# Patient Record
Sex: Male | Born: 1955 | Race: Black or African American | Hispanic: No | Smoking: Never smoker
Health system: Southern US, Community
[De-identification: ages and names within clinical notes are randomized; demographics above are authoritative.]

## PROBLEM LIST (undated history)

## (undated) DIAGNOSIS — I10 Essential (primary) hypertension: Secondary | ICD-10-CM

## (undated) DIAGNOSIS — N200 Calculus of kidney: Secondary | ICD-10-CM

## (undated) DIAGNOSIS — Z87442 Personal history of urinary calculi: Secondary | ICD-10-CM

## (undated) HISTORY — PX: OTHER SURGICAL HISTORY: SHX169

## (undated) HISTORY — PX: LITHOTRIPSY: SUR834

---

## 2006-09-04 ENCOUNTER — Emergency Department: Payer: Self-pay | Admitting: Emergency Medicine

## 2006-09-19 ENCOUNTER — Ambulatory Visit: Payer: Self-pay | Admitting: Specialist

## 2006-09-29 ENCOUNTER — Ambulatory Visit: Payer: Self-pay | Admitting: Specialist

## 2006-10-12 ENCOUNTER — Ambulatory Visit: Payer: Self-pay | Admitting: Specialist

## 2006-11-08 ENCOUNTER — Ambulatory Visit: Payer: Self-pay | Admitting: Specialist

## 2006-12-15 ENCOUNTER — Ambulatory Visit: Payer: Self-pay | Admitting: Specialist

## 2006-12-23 ENCOUNTER — Ambulatory Visit: Payer: Self-pay | Admitting: Specialist

## 2008-03-05 IMAGING — CR DG ABDOMEN 1V
1 series · 2 of 2 positions shown · non-contrast
Comparison: none

REASON FOR EXAM: renal calculi-lithotripsy
COMMENTS:

[Series 1: view not recorded · 0.17mm/px · 2 of 2 slices shown]
[im 1/2]
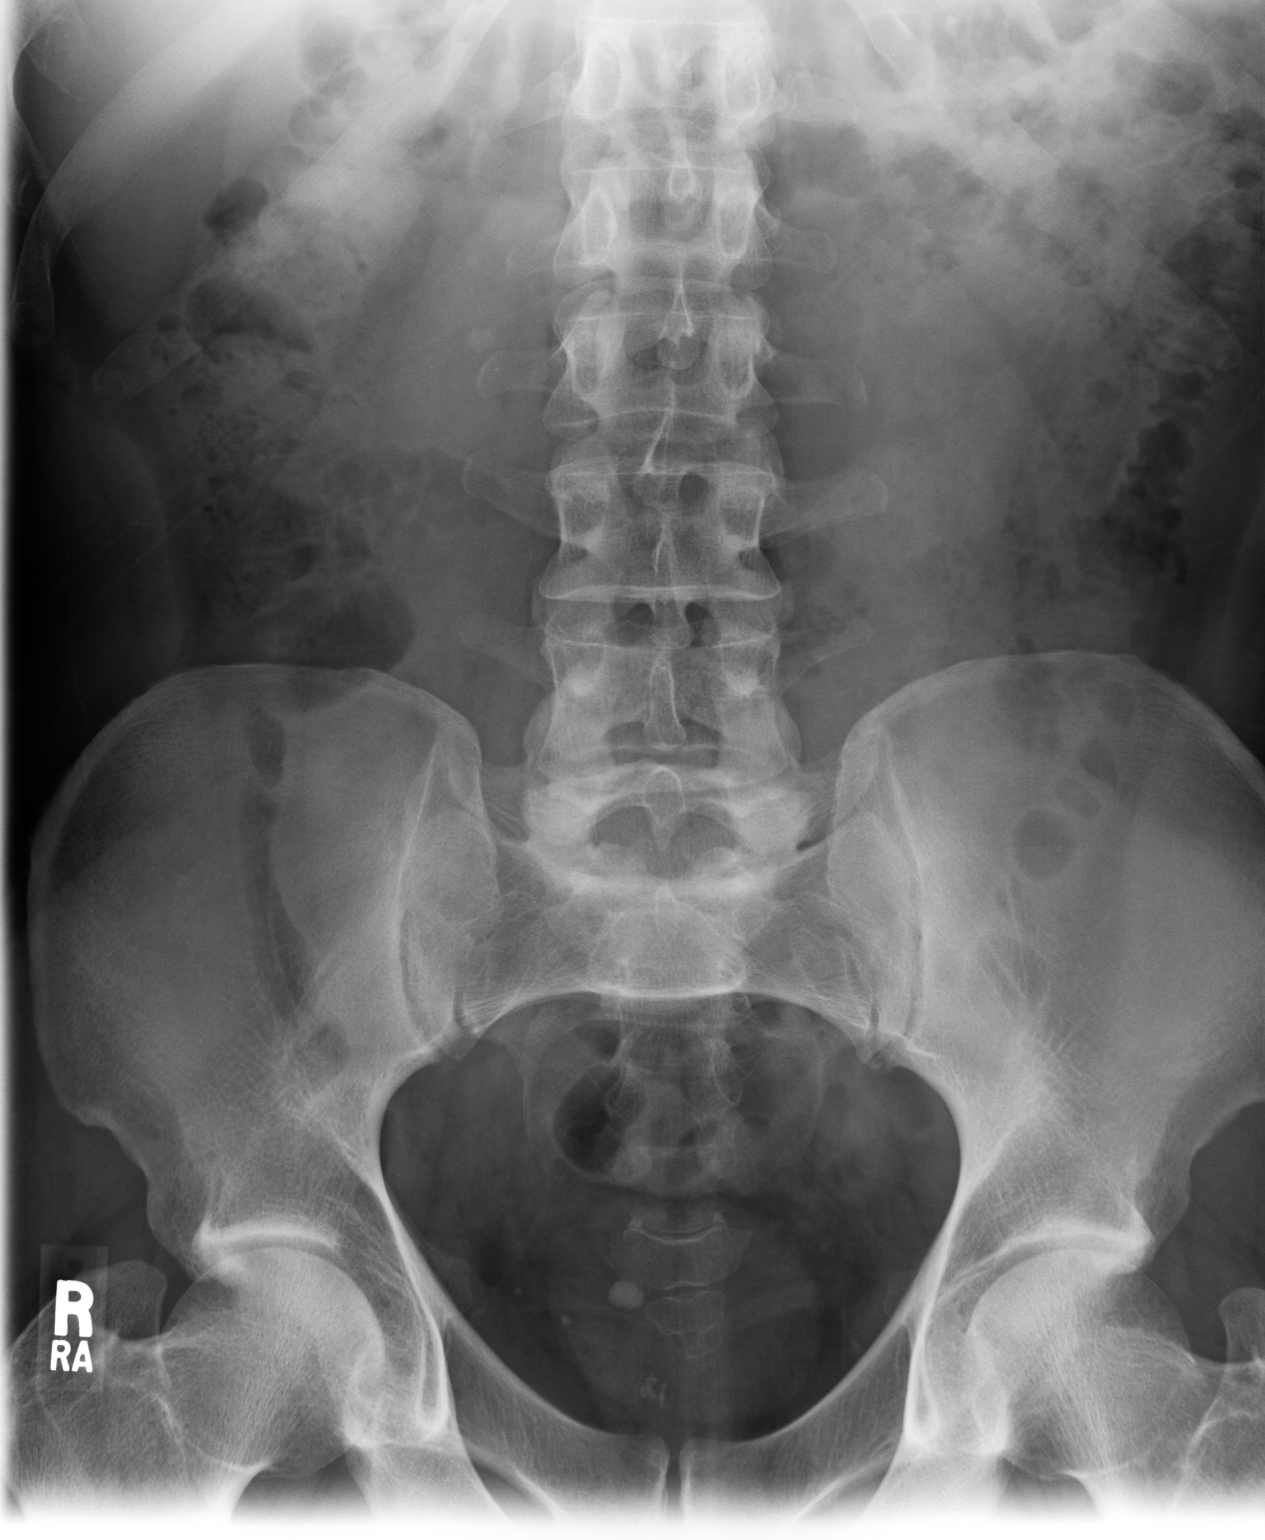
[im 2/2]
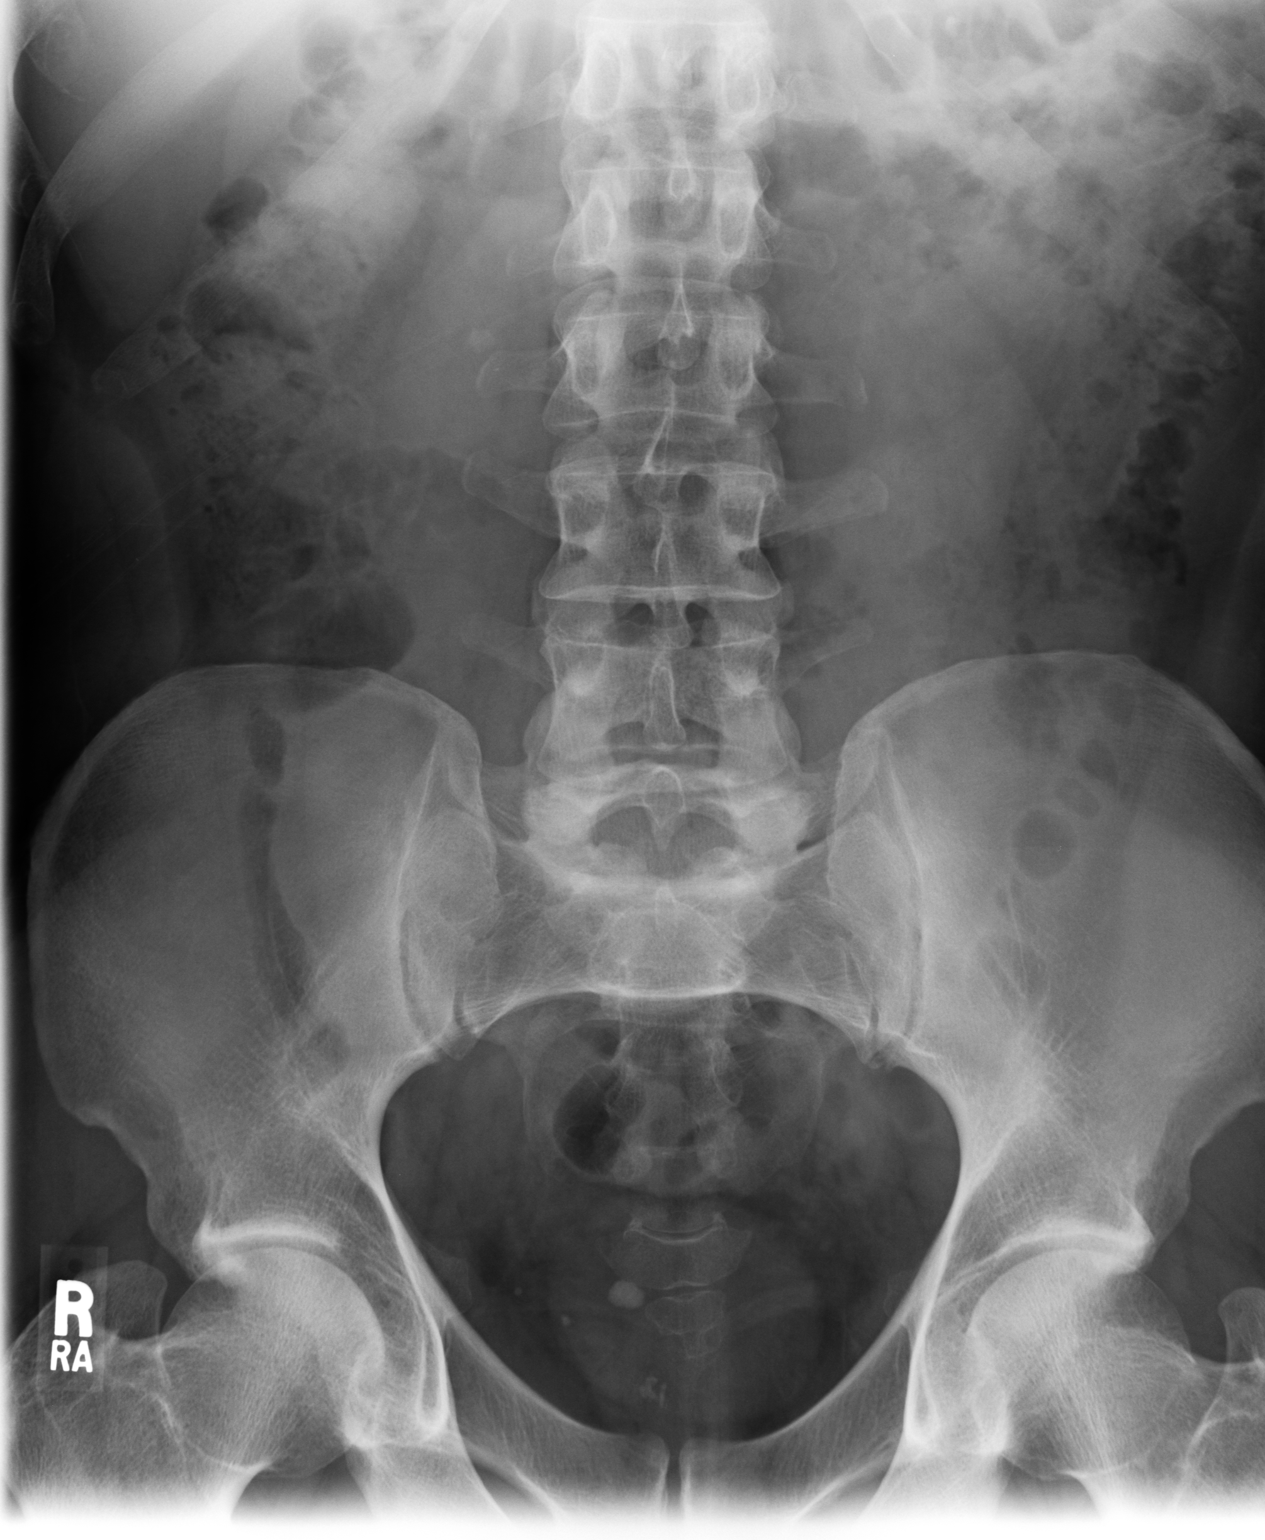

[2 of 2 positions shown; findings below may reference images not displayed]

PROCEDURE:     DXR - DXR KIDNEY URETER BLADDER  - September 29, 2006  [DATE]

RESULT:     AP view of the abdomen shows a 1 cm calcification projected at
the level of the RIGHT ureterovesical junction and consistent with a distal
RIGHT ureteral stone. Also noted is an 8.3 mm calcification projected just
above the transverse process of L3 on the RIGHT.  The finding is compatible
with either a proximal RIGHT ureteral stone or possibly a gallstone. There
also appear to be 1 or 2 renal calcifications bilaterally, but additional
stones may be present as both kidneys are partially obscured by overlying
bowel and bowel content.
IMPRESSION: 1)There are observed two calcifications on the RIGHT consistent with a
proximal and a distal RIGHT ureteral stone.

2)Bilateral renal calcifications are noted.

## 2008-10-06 ENCOUNTER — Emergency Department: Payer: Self-pay | Admitting: Emergency Medicine

## 2008-10-07 ENCOUNTER — Emergency Department: Payer: Self-pay | Admitting: Emergency Medicine

## 2011-01-12 ENCOUNTER — Emergency Department: Payer: Self-pay | Admitting: Emergency Medicine

## 2011-04-22 ENCOUNTER — Ambulatory Visit: Payer: Self-pay | Admitting: Urology

## 2011-06-08 ENCOUNTER — Ambulatory Visit: Payer: Self-pay | Admitting: Urology

## 2011-06-17 ENCOUNTER — Ambulatory Visit: Payer: Self-pay | Admitting: Urology

## 2011-06-23 ENCOUNTER — Ambulatory Visit: Payer: Self-pay | Admitting: Urology

## 2011-07-29 ENCOUNTER — Ambulatory Visit: Payer: Self-pay | Admitting: Urology

## 2011-08-11 ENCOUNTER — Ambulatory Visit: Payer: Self-pay | Admitting: Urology

## 2011-09-09 ENCOUNTER — Ambulatory Visit: Payer: Self-pay | Admitting: Urology

## 2011-10-08 ENCOUNTER — Ambulatory Visit: Payer: Self-pay | Admitting: Urology

## 2011-11-04 ENCOUNTER — Ambulatory Visit: Payer: Self-pay | Admitting: Urology

## 2014-02-08 ENCOUNTER — Ambulatory Visit: Payer: Self-pay | Admitting: Gastroenterology

## 2014-02-11 LAB — PATHOLOGY REPORT

## 2016-02-25 ENCOUNTER — Encounter: Payer: Self-pay | Admitting: Emergency Medicine

## 2016-02-25 ENCOUNTER — Emergency Department
Admission: EM | Admit: 2016-02-25 | Discharge: 2016-02-25 | Disposition: A | Discharge status: Home / Self Care | Payer: BLUE CROSS/BLUE SHIELD | Attending: Emergency Medicine | Admitting: Emergency Medicine

## 2016-02-25 ENCOUNTER — Emergency Department: Payer: BLUE CROSS/BLUE SHIELD

## 2016-02-25 DIAGNOSIS — N200 Calculus of kidney: Secondary | ICD-10-CM

## 2016-02-25 DIAGNOSIS — R109 Unspecified abdominal pain: Secondary | ICD-10-CM | POA: Diagnosis present

## 2016-02-25 HISTORY — DX: Calculus of kidney: N20.0

## 2016-02-25 LAB — LIPASE, BLOOD: LIPASE: 16 U/L (ref 11–51)

## 2016-02-25 LAB — BASIC METABOLIC PANEL
Anion gap: 6 (ref 5–15)
BUN: 17 mg/dL (ref 6–20)
CHLORIDE: 106 mmol/L (ref 101–111)
CO2: 24 mmol/L (ref 22–32)
CREATININE: 0.95 mg/dL (ref 0.61–1.24)
Calcium: 8.8 mg/dL — ABNORMAL LOW (ref 8.9–10.3)
GFR calc Af Amer: >60 mL/min (ref >60)
GFR calc non Af Amer: >60 mL/min (ref >60)
Glucose, Bld: 127 mg/dL — ABNORMAL HIGH (ref 65–99)
Potassium: 4 mmol/L (ref 3.5–5.1)
SODIUM: 136 mmol/L (ref 135–145)

## 2016-02-25 LAB — URINALYSIS COMPLETE WITH MICROSCOPIC (ARMC ONLY)
BILIRUBIN URINE: NEGATIVE
Bacteria, UA: NONE SEEN
GLUCOSE, UA: NEGATIVE mg/dL
Ketones, ur: NEGATIVE mg/dL
Nitrite: NEGATIVE
Protein, ur: 30 mg/dL — AB
SQUAMOUS EPITHELIAL / LPF: NONE SEEN
Specific Gravity, Urine: 1.028 (ref 1.005–1.030)
pH: 5 (ref 5.0–8.0)

## 2016-02-25 LAB — CBC
HCT: 46.7 % (ref 40.0–52.0)
Hemoglobin: 15.8 g/dL (ref 13.0–18.0)
MCH: 29.6 pg (ref 26.0–34.0)
MCHC: 33.9 g/dL (ref 32.0–36.0)
MCV: 87.2 fL (ref 80.0–100.0)
PLATELETS: 221 10*3/uL (ref 150–440)
RBC: 5.35 MIL/uL (ref 4.40–5.90)
RDW: 13.8 % (ref 11.5–14.5)
WBC: 7.1 10*3/uL (ref 3.8–10.6)

## 2016-02-25 MED ORDER — ONDANSETRON HCL 4 MG/2ML IJ SOLN
INTRAMUSCULAR | Status: AC
  Administered 2016-02-25: 4 mg via INTRAVENOUS
  Filled 2016-02-25: qty 2

## 2016-02-25 MED ORDER — ONDANSETRON HCL 4 MG/2ML IJ SOLN
4.0000 mg | Freq: Once | INTRAMUSCULAR | Status: AC
  Administered 2016-02-25: 4 mg via INTRAVENOUS

## 2016-02-25 MED ORDER — ONDANSETRON HCL 4 MG/2ML IJ SOLN
4.0000 mg | Freq: Once | INTRAMUSCULAR | Status: AC
  Administered 2016-02-25: 4 mg via INTRAVENOUS
  Filled 2016-02-25: qty 2

## 2016-02-25 MED ORDER — SODIUM CHLORIDE 0.9 % IV SOLN
Freq: Once | INTRAVENOUS | Status: AC
  Administered 2016-02-25: 08:00:00 via INTRAVENOUS

## 2016-02-25 MED ORDER — KETOROLAC TROMETHAMINE 30 MG/ML IJ SOLN
INTRAMUSCULAR | Status: AC
  Administered 2016-02-25: 30 mg via INTRAVENOUS
  Filled 2016-02-25: qty 1

## 2016-02-25 MED ORDER — HYDROMORPHONE HCL 1 MG/ML IJ SOLN
1.0000 mg | Freq: Once | INTRAMUSCULAR | Status: AC
  Administered 2016-02-25: 1 mg via INTRAVENOUS
  Filled 2016-02-25: qty 1

## 2016-02-25 MED ORDER — ONDANSETRON HCL 4 MG PO TABS: 4.0000 mg | ORAL_TABLET | Freq: Every day | ORAL | Status: DC | PRN

## 2016-02-25 MED ORDER — KETOROLAC TROMETHAMINE 30 MG/ML IJ SOLN
30.0000 mg | Freq: Once | INTRAMUSCULAR | Status: AC
  Administered 2016-02-25: 30 mg via INTRAVENOUS

## 2016-02-25 MED ORDER — OXYCODONE-ACETAMINOPHEN 5-325 MG PO TABS: 2.0000 | ORAL_TABLET | Freq: Four times a day (QID) | ORAL | Status: DC | PRN

## 2016-02-25 NOTE — ED Provider Notes (Signed)
[facility] Emergency Department Provider Note     Time seen: ----------------------------------------- 8:44 AM on 02/25/2016 -----------------------------------------    I have reviewed the triage vital signs and the nursing notes.   HISTORY  Chief Complaint Flank Pain    HPI Martin Burkley [place] Sr. is a 60 y.o. male who presents the ER with right flank pain. Patient reports his been dealing with kidney stones for the past several weeks but the pain increased today, he said stabbing pain, no nausea. Patient reports similar history of previous kidney stones, said for kidney stone surgeries in the past. Pain is severe, nothing is helped it.   Past Medical History  Diagnosis Date  . Kidney stones     There are no active problems to display for this patient.   History reviewed. No pertinent past surgical history.  Allergies Review of patient's allergies indicates not on file.  Social History Social History  Substance Use Topics  . Smoking status: Never Smoker   . Smokeless tobacco: None  . Alcohol Use: No    Review of Systems Constitutional: Negative for fever. Eyes: Negative for visual changes. ENT: Negative for sore throat. Cardiovascular: Negative for chest pain. Respiratory: Negative for shortness of breath. Gastrointestinal: Positive for abdominal pain Genitourinary: Negative for dysuria. Musculoskeletal: Negative for back pain. Skin: Negative for rash. Neurological: Negative for headaches, focal weakness or numbness.  10-point ROS otherwise negative.  ____________________________________________   PHYSICAL EXAM:  VITAL SIGNS: ED Triage Vitals  Enc Vitals Group     BP 02/25/16 0603 150/81 mmHg     Pulse Rate 02/25/16 0603 86     Resp 02/25/16 0603 20     Temp 02/25/16 0603 98.2 F (36.8 C)     Temp Source 02/25/16 0603 Oral     SpO2 02/25/16 0603 95 %     Weight 02/25/16 0603 195 lb (88.451 kg)     Height 02/25/16  0603 6\' 2"  (1.88 m)     Head Cir --      Peak Flow --      Pain Score 02/25/16 0604 7     Pain Loc --      Pain Edu? --      Excl. in [place]? --     Constitutional: Alert and oriented. Mild distress Eyes: Conjunctivae are normal. PERRL. Normal extraocular movements. ENT   Head: Normocephalic and atraumatic.   Nose: No congestion/rhinnorhea.   Mouth/Throat: Mucous membranes are moist.   Neck: No stridor. Cardiovascular: Normal rate, regular rhythm. Normal and symmetric distal pulses are present in all extremities. No murmurs, rubs, or gallops. Respiratory: Normal respiratory effort without tachypnea nor retractions. Breath sounds are clear and equal bilaterally. No wheezes/rales/rhonchi. Gastrointestinal: Right flank tenderness, no rebound or guarding. Normal bowel sounds.  Musculoskeletal: Nontender with normal range of motion in all extremities. No joint effusions.  No lower extremity tenderness nor edema. Neurologic:  Normal speech and language. No gross focal neurologic deficits are appreciated.  Skin:  Skin is warm, dry and intact. No rash noted. Psychiatric: Mood and affect are normal. Speech and behavior are normal. Patient exhibits appropriate insight and judgment. ____________________________________________  ED COURSE:  Pertinent labs & imaging results that were available during my care of the patient were reviewed by me and considered in my medical decision making (see chart for details). Patient is in mild to moderate distress from apparent kidney stone and right flank pain. He'll receive IV Dilaudid, saline and we will likely need CT imaging.  ____________________________________________    LABS (pertinent positives/negatives)  Labs Reviewed  BASIC METABOLIC PANEL - Abnormal; Notable for the following:    Glucose, Bld 127 (*)    Calcium 8.8 (*)    All other components within normal limits  URINALYSIS COMPLETEWITH MICROSCOPIC ([facility] ONLY) - Abnormal; Notable  for the following:    Color, Urine YELLOW (*)    APPearance CLEAR (*)    Hgb urine dipstick 3+ (*)    Protein, ur 30 (*)    Leukocytes, UA TRACE (*)    All other components within normal limits  CBC  LIPASE, BLOOD    RADIOLOGY Images were viewed by me  CT renal protocol IMPRESSION: Intrarenal calculi bilaterally. No hydronephrosis. No ureteral calculi on either side.  Most small bowel loops are fluid-filled. This finding may be seen normally but also may be indicative of a degree of early ileus or enteritis. No bowel obstruction.  Appendix appears normal. No abscess.  There are scattered prostatic calculi.  There is a small ventral hernia containing only fat. There is fat in each inguinal ring.  ____________________________________________  FINAL ASSESSMENT AND PLAN  Kidney stones  Plan: Patient with labs and imaging as dictated above. Patient with a possibly recently passed kidney stone. CT scan is unremarkable. He'll be discharged with pain medication and close follow-up with his doctor for reevaluation.   Earleen Newport, MD   Earleen Newport, MD 02/25/16 [phone]

## 2016-02-25 NOTE — ED Notes (Signed)
Pt presents to triage room ambulatory with steady gait, pt reports he has been dealing with kidney stones for the past 2 weeks but pain increased today. Pt reports blood in urine, pt reports he has been avoiding to have procedure for kidney stones, reports he has had 4 surgeries in the past for the same. Pt denies visiting his PCP . Pt talks in complete sentences no respiratory distress noted

## 2016-02-25 NOTE — Discharge Instructions (Signed)
Kidney Stones °Kidney stones (urolithiasis) are deposits that form inside your kidneys. The intense pain is caused by the stone moving through the urinary tract. When the stone moves, the ureter goes into spasm around the stone. The stone is usually passed in the urine.  °CAUSES  °· A disorder that makes certain neck glands produce too much parathyroid hormone (primary hyperparathyroidism). °· A buildup of uric acid crystals, similar to gout in your joints. °· Narrowing (stricture) of the ureter. °· A kidney obstruction present at birth (congenital obstruction). °· Previous surgery on the kidney or ureters. °· Numerous kidney infections. °SYMPTOMS  °· Feeling sick to your stomach (nauseous). °· Throwing up (vomiting). °· Blood in the urine (hematuria). °· Pain that usually spreads (radiates) to the groin. °· Frequency or urgency of urination. °DIAGNOSIS  °· Taking a history and physical exam. °· Blood or urine tests. °· CT scan. °· Occasionally, an examination of the inside of the urinary bladder (cystoscopy) is performed. °TREATMENT  °· Observation. °· Increasing your fluid intake. °· Extracorporeal shock wave lithotripsy--This is a noninvasive procedure that uses shock waves to break up kidney stones. °· Surgery may be needed if you have severe pain or persistent obstruction. There are various surgical procedures. Most of the procedures are performed with the use of small instruments. Only small incisions are needed to accommodate these instruments, so recovery time is minimized. °The size, location, and chemical composition are all important variables that will determine the proper choice of action for you. Talk to your health care provider to better understand your situation so that you will minimize the risk of injury to yourself and your kidney.  °HOME CARE INSTRUCTIONS  °· Drink enough water and fluids to keep your urine clear or pale yellow. This will help you to pass the stone or stone fragments. °· Strain  all urine through the provided strainer. Keep all particulate matter and stones for your health care provider to see. The stone causing the pain may be as small as a grain of salt. It is very important to use the strainer each and every time you pass your urine. The collection of your stone will allow your health care provider to analyze it and verify that a stone has actually passed. The stone analysis will often identify what you can do to reduce the incidence of recurrences. °· Only take over-the-counter or prescription medicines for pain, discomfort, or fever as directed by your health care provider. °· Keep all follow-up visits as told by your health care provider. This is important. °· Get follow-up X-rays if required. The absence of pain does not always mean that the stone has passed. It may have only stopped moving. If the urine remains completely obstructed, it can cause loss of kidney function or even complete destruction of the kidney. It is your responsibility to make sure X-rays and follow-ups are completed. Ultrasounds of the kidney can show blockages and the status of the kidney. Ultrasounds are not associated with any radiation and can be performed easily in a matter of minutes. °· Make changes to your daily diet as told by your health care provider. You may be told to: °¨ Limit the amount of salt that you eat. °¨ Eat 5 or more servings of fruits and vegetables each day. °¨ Limit the amount of meat, poultry, fish, and eggs that you eat. °· Collect a 24-hour urine sample as told by your health care provider. You may need to collect another urine sample every 6-12   months. °SEEK MEDICAL CARE IF: °· You experience pain that is progressive and unresponsive to any pain medicine you have been prescribed. °SEEK IMMEDIATE MEDICAL CARE IF:  °· Pain cannot be controlled with the prescribed medicine. °· You have a fever or shaking chills. °· The severity or intensity of pain increases over 18 hours and is not  relieved by pain medicine. °· You develop a new onset of abdominal pain. °· You feel faint or pass out. °· You are unable to urinate. °  °This information is not intended to replace advice given to you by your health care provider. Make sure you discuss any questions you have with your health care provider. °  °Document Released: 11/08/2005 Document Revised: 07/30/2015 Document Reviewed: 04/11/2013 °Elsevier Interactive Patient Education ©2016 [employer] ° °

## 2018-08-09 ENCOUNTER — Emergency Department: Payer: Self-pay

## 2018-08-09 ENCOUNTER — Encounter: Payer: Self-pay | Admitting: *Deleted

## 2018-08-09 ENCOUNTER — Other Ambulatory Visit: Payer: Self-pay

## 2018-08-09 ENCOUNTER — Emergency Department
Admission: EM | Admit: 2018-08-09 | Discharge: 2018-08-09 | Disposition: A | Discharge status: Home / Self Care | Payer: Self-pay | Attending: Emergency Medicine | Admitting: Emergency Medicine

## 2018-08-09 DIAGNOSIS — M1711 Unilateral primary osteoarthritis, right knee: Secondary | ICD-10-CM | POA: Insufficient documentation

## 2018-08-09 MED ORDER — MELOXICAM 15 MG PO TABS: 15.0000 mg | ORAL_TABLET | Freq: Every day | ORAL | 0 refills | Status: AC

## 2018-08-09 NOTE — ED Provider Notes (Signed)
[facility] Emergency Department Provider Note   ____________________________________________   First MD Initiated Contact with Patient 08/09/18 1627     (approximate)  I have reviewed the triage vital signs and the nursing notes.   HISTORY  Chief Complaint Knee Pain   HPI Martin Dinino [place] Sr. is a 62 y.o. male presents with right knee pain that began this afternoon when he got out of a truck and was getting into a car.  He states that his knee popped while he was walking.  He denies any previous injury to his knee or injury today.  He denies actual pain more than he is concerned about the popping.  He has not taken any over-the-counter medication since this began approximately 1 hour ago.  Currently rates his pain a 0/10.  Past Medical History:  Diagnosis Date  . Kidney stones     There are no active problems to display for this patient.   No past surgical history on file.  Prior to Admission medications   Medication Sig Start Date End Date Taking? Authorizing Provider  meloxicam (MOBIC) 15 MG tablet Take 1 tablet (15 mg total) by mouth daily. 08/09/18 08/09/19  Johnn Hai, PA-C    Allergies Patient has no known allergies.  No family history on file.  Social History Social History   Tobacco Use  . Smoking status: Never Smoker  . Smokeless tobacco: Never Used  Substance Use Topics  . Alcohol use: No  . Drug use: Not on file    Review of Systems Constitutional: No fever/chills Cardiovascular: Denies chest pain. Respiratory: Denies shortness of breath. Musculoskeletal: Positive for right knee discomfort.  Positive for "popping". Skin: Negative for rash. Neurological: Negative for headaches, focal weakness or numbness. ___________________________________________   PHYSICAL EXAM:  VITAL SIGNS: ED Triage Vitals  Enc Vitals Group     BP 08/09/18 1619 137/86     Pulse Rate 08/09/18 1619 70     Resp 08/09/18 1619 20   Temp 08/09/18 1619 98.5 F (36.9 C)     Temp Source 08/09/18 1619 Oral     SpO2 08/09/18 1619 96 %     Weight 08/09/18 1618 271 lb (122.9 kg)     Height 08/09/18 1618 6\' 3"  (1.905 m)     Head Circumference --      Peak Flow --      Pain Score 08/09/18 1618 0     Pain Loc --      Pain Edu? --      Excl. in [place]? --    Constitutional: Alert and oriented. Well appearing and in no acute distress. Eyes: Conjunctivae are normal.  Head: Atraumatic. Neck: No stridor.   Cardiovascular: Normal rate, regular rhythm. Grossly normal heart sounds.  Good peripheral circulation. Respiratory: Normal respiratory effort.  No retractions. Lungs CTAB. Musculoskeletal: Right knee no gross deformity is noted.  There is a popping noted with range of motion with minimal crepitus.  Range of motion is not restricted.  There is no soft tissue edema or effusion present.  Skin is intact without any ecchymosis or abrasions.  Patient is ambulatory without any assistance. Neurologic:  Normal speech and language. No gross focal neurologic deficits are appreciated. No gait instability. Skin:  Skin is warm, dry and intact. No rash noted. Psychiatric: Mood and affect are normal. Speech and behavior are normal.  ____________________________________________   LABS (all labs ordered are listed, but only abnormal results are displayed)  Labs Reviewed -  No data to display  RADIOLOGY  ED MD interpretation:   Official radiology report(s): Dg Knee Complete 4 Views Right  Result Date: 08/09/2018 CLINICAL DATA:  Right knee pain with no known injury. EXAM: RIGHT KNEE - COMPLETE 4+ VIEW COMPARISON:  None. FINDINGS: No evidence of fracture, dislocation, or joint effusion. The femorotibial joint spaces are normal. Mild osteophytosis of the superior patella. Soft tissues are unremarkable. IMPRESSION: No fracture, dislocation or effusion of the right knee. Mild patellofemoral osteoarthrosis. Electronically Signed   By: Ulyses Jarred M.D.   On: 08/09/2018 17:23    ____________________________________________   PROCEDURES  Procedure(s) performed: None  Procedures  Critical Care performed: No  ____________________________________________   INITIAL IMPRESSION / ASSESSMENT AND PLAN / ED COURSE  As part of my medical decision making, I reviewed the following data within the electronic MEDICAL RECORD NUMBER Notes from prior ED visits and [place] Controlled Substance Database  Patient presents to the ED with complaint of right knee pain without known injury.  Patient states that started popping today while he was walking.  He denies any previous injury to his knee.  Exam is unremarkable with the exception of the popping sensation noted on range of motion.  X-rays were reassuring and patient was made aware that he has some mild osteoarthritis on his patella.  Patient was given a prescription for meloxicam 15 mg 1 daily with food.  He is to follow-up with his PCP or Dr. Mack Guise for any continued problems with his knee.  ____________________________________________   FINAL CLINICAL IMPRESSION(S) / ED DIAGNOSES  Final diagnoses:  Osteoarthritis of right knee, unspecified osteoarthritis type     ED Discharge Orders         Ordered    meloxicam (MOBIC) 15 MG tablet  Daily     08/09/18 1729           Note:  This document was prepared using Dragon voice recognition software and may include unintentional dictation errors.    Johnn Hai, PA-C 08/09/18 1827    Earleen Newport, MD 08/09/18 [phone]

## 2018-08-09 NOTE — ED Triage Notes (Signed)
Pt has right knee pain.  No known injury.  Pt states it popped today while walking.  Pt alert.

## 2018-08-09 NOTE — Discharge Instructions (Signed)
Follow-up with Dr. Mack Guise who is an orthopedist if any continued problems with your knee.  Begin taking meloxicam 1 time a day with food for inflammation.  This medication should not cause drowsiness or interfere with working or driving.

## 2021-03-27 ENCOUNTER — Encounter: Payer: Self-pay | Admitting: Family Medicine

## 2021-04-16 ENCOUNTER — Other Ambulatory Visit: Payer: Self-pay

## 2021-04-16 MED ORDER — PEG 3350-KCL-NA BICARB-NACL 420 G PO SOLR: ORAL | 0 refills | Status: DC

## 2021-04-27 ENCOUNTER — Telehealth: Payer: Self-pay | Admitting: Gastroenterology

## 2021-04-27 NOTE — Telephone Encounter (Signed)
Patient LVM asking for call back to answer questions about prep medication

## 2021-04-27 NOTE — Telephone Encounter (Signed)
Patient has some questions about the [place]. He asked if he was supposed to do it also 5 hours before procedure. Informed patient he was only supposed to do it once which is at 5pm the night before procedure. He is suppose to drink 8oz every 20 to 30 minutes till it is gone

## 2021-05-04 ENCOUNTER — Encounter
Admission: RE | Disposition: A | Discharge status: Home / Self Care | Payer: Self-pay | Source: Home / Self Care | Attending: Gastroenterology

## 2021-05-04 ENCOUNTER — Ambulatory Visit: Payer: BC Managed Care – PPO | Admitting: Registered Nurse

## 2021-05-04 ENCOUNTER — Ambulatory Visit
Admission: RE | Admit: 2021-05-04 | Discharge: 2021-05-04 | Disposition: A | Discharge status: Home / Self Care | Payer: BC Managed Care – PPO | Attending: Gastroenterology | Admitting: Gastroenterology

## 2021-05-04 ENCOUNTER — Encounter: Payer: Self-pay | Admitting: Gastroenterology

## 2021-05-04 DIAGNOSIS — Z79899 Other long term (current) drug therapy: Secondary | ICD-10-CM | POA: Insufficient documentation

## 2021-05-04 DIAGNOSIS — D122 Benign neoplasm of ascending colon: Secondary | ICD-10-CM | POA: Insufficient documentation

## 2021-05-04 DIAGNOSIS — K648 Other hemorrhoids: Secondary | ICD-10-CM | POA: Diagnosis not present

## 2021-05-04 DIAGNOSIS — D120 Benign neoplasm of cecum: Secondary | ICD-10-CM | POA: Insufficient documentation

## 2021-05-04 DIAGNOSIS — Z1211 Encounter for screening for malignant neoplasm of colon: Secondary | ICD-10-CM

## 2021-05-04 DIAGNOSIS — K635 Polyp of colon: Secondary | ICD-10-CM | POA: Diagnosis not present

## 2021-05-04 DIAGNOSIS — Z7982 Long term (current) use of aspirin: Secondary | ICD-10-CM | POA: Diagnosis not present

## 2021-05-04 HISTORY — PX: COLONOSCOPY: SHX5424

## 2021-05-04 HISTORY — DX: Personal history of urinary calculi: Z87.442

## 2021-05-04 SURGERY — COLONOSCOPY
Anesthesia: General

## 2021-05-04 MED ORDER — SODIUM CHLORIDE 0.9 % IV SOLN
INTRAVENOUS | Status: DC
  Administered 2021-05-04: 1000 mL via INTRAVENOUS

## 2021-05-04 MED ORDER — PROPOFOL 500 MG/50ML IV EMUL
INTRAVENOUS | Status: DC | PRN
  Administered 2021-05-04: 150 ug/kg/min via INTRAVENOUS

## 2021-05-04 MED ORDER — LIDOCAINE HCL (CARDIAC) PF 100 MG/5ML IV SOSY
PREFILLED_SYRINGE | INTRAVENOUS | Status: DC | PRN
  Administered 2021-05-04: 40 mg via INTRAVENOUS

## 2021-05-04 MED ORDER — PROPOFOL 500 MG/50ML IV EMUL
INTRAVENOUS | Status: AC
  Filled 2021-05-04: qty 50

## 2021-05-04 MED ORDER — SPOT INK MARKER SYRINGE KIT
PACK | SUBMUCOSAL | Status: DC | PRN
  Administered 2021-05-04: 2 mL via SUBMUCOSAL

## 2021-05-04 MED ORDER — LIDOCAINE HCL (PF) 2 % IJ SOLN
INTRAMUSCULAR | Status: AC
  Filled 2021-05-04: qty 2

## 2021-05-04 MED ORDER — PROPOFOL 10 MG/ML IV BOLUS
INTRAVENOUS | Status: DC | PRN
  Administered 2021-05-04: 70 mg via INTRAVENOUS

## 2021-05-04 NOTE — Transfer of Care (Signed)
Immediate Anesthesia Transfer of Care Note  Patient: Martin Wayne [place] Sr.  Procedure(s) Performed: Procedure(s): COLONOSCOPY (N/A)  Patient Location: PACU and Endoscopy Unit  Anesthesia Type:General  Level of Consciousness: sedated  Airway & Oxygen Therapy: Patient Spontanous Breathing and Patient connected to nasal cannula oxygen  Post-op Assessment: Report given to RN and Post -op Vital signs reviewed and stable  Post vital signs: Reviewed and stable  Last Vitals:  Vitals:   05/04/21 1033 05/04/21 1155  BP: (!) 142/88 117/64  Pulse: 67 66  Resp: 18 15  Temp: (!) 36.4 C (!) 35.8 C  SpO2: 72% 82%    Complications: No apparent anesthesia complications

## 2021-05-04 NOTE — Anesthesia Procedure Notes (Signed)
Procedure Name: MAC Date/Time: 05/04/2021 11:20 AM Performed by: Jerrye Noble, CRNA Pre-anesthesia Checklist: Emergency Drugs available, Suction available, Patient identified and Patient being monitored Patient Re-evaluated:Patient Re-evaluated prior to induction Oxygen Delivery Method: Nasal cannula

## 2021-05-04 NOTE — Op Note (Signed)
[facility] Gastroenterology Patient Name: Martin Zavala Procedure Date: 05/04/2021 11:19 AM MRN: [id number] Account #: [id number] Date of Birth: [birth year] Admit Type: Outpatient Age: 65 Room: [facility] ENDO ROOM 1 Gender: Male Note Status: Finalized Procedure:             Colonoscopy Indications:           Screening for colorectal malignant neoplasm Providers:             Lin Landsman MD, MD Referring MD:          Jordan Likes. Lavena Bullion (Referring MD) Medicines:             General Anesthesia Complications:         No immediate complications. Estimated blood loss: None. Procedure:             Pre-Anesthesia Assessment:                        - Prior to the procedure, a History and Physical was                         performed, and patient medications and allergies were                         reviewed. The patient is competent. The risks and                         benefits of the procedure and the sedation options and                         risks were discussed with the patient. All questions                         were answered and informed consent was obtained.                         Patient identification and proposed procedure were                         verified by the physician, the nurse, the                         anesthesiologist, the anesthetist and the technician                         in the pre-procedure area in the procedure room in the                         endoscopy suite. Mental Status Examination: alert and                         oriented. Airway Examination: normal oropharyngeal                         airway and neck mobility. Respiratory Examination:                         clear to auscultation. CV Examination: normal.  Prophylactic Antibiotics: The patient does not require                         prophylactic antibiotics. Prior Anticoagulants: The                         patient has taken no previous  anticoagulant or                         antiplatelet agents. ASA Grade Assessment: III - A                         patient with severe systemic disease. After reviewing                         the risks and benefits, the patient was deemed in                         satisfactory condition to undergo the procedure. The                         anesthesia plan was to use general anesthesia.                         Immediately prior to administration of medications,                         the patient was re-assessed for adequacy to receive                         sedatives. The heart rate, respiratory rate, oxygen                         saturations, blood pressure, adequacy of pulmonary                         ventilation, and response to care were monitored                         throughout the procedure. The physical status of the                         patient was re-assessed after the procedure.                        After obtaining informed consent, the colonoscope was                         passed under direct vision. Throughout the procedure,                         the patient's blood pressure, pulse, and oxygen                         saturations were monitored continuously. The                         Colonoscope was introduced through the anus and  advanced to the the cecum, identified by appendiceal                         orifice and ileocecal valve. The colonoscopy was                         performed without difficulty. The patient tolerated                         the procedure well. The quality of the bowel                         preparation was evaluated using the BBPS [facility] Bowel                         Preparation Scale) with scores of: Right Colon = 3,                         Transverse Colon = 3 and Left Colon = 3 (entire mucosa                         seen well with no residual staining, small fragments                         of stool or  opaque liquid). The total BBPS score                         equals 9. Findings:      The perianal and digital rectal examinations were normal. Pertinent       negatives include normal sphincter tone and no palpable rectal lesions.      A 4 mm polyp was found in the cecum. The polyp was sessile. The polyp       was removed with a cold snare. Resection and retrieval were complete.      A 9 mm polypoid lesion was found at the ileocecal valve. The lesion was       sessile. No bleeding was present. The polyp was removed with a hot       snare. Resection and retrieval were complete.      A 15 mm polyp was found in the proximal ascending colon. The polyp was       carpet-like and sessile. Preparations were made for mucosal resection.       NBI of [place] was injected with adequate lift of the lesion from the       muscularis propria. Snare mucosal resection with Jabier Mutton net retrieval was       performed. A 20 mm area was resected. Resection and retrieval were       complete. There was no bleeding during and at the end of the procedure.       To prevent bleeding after mucosal resection, one hemostatic clip was       successfully placed (MR conditional). There was no bleeding during, or       at the end, of the procedure. Area was tattooed with an injection of       Spot (carbon black).      Non-bleeding hemorrhoids were found during retroflexion. Impression:            - One  4 mm polyp in the cecum, removed with a cold                         snare. Resected and retrieved.                        - Benign polypoid lesion at the ileocecal valve.                         Complete removal was accomplished.                        - One 15 mm polyp in the proximal ascending colon,                         removed with mucosal resection. Resected and                         retrieved. Clip (MR conditional) was placed. Tattooed.                        - Non-bleeding hemorrhoids.                        -  Mucosal resection was performed. Resection and                         retrieval were complete. Recommendation:        - Discharge patient to home (with escort).                        - Resume previous diet today.                        - Continue present medications.                        - Await pathology results.                        - Repeat colonoscopy in 5 years for surveillance based                         on pathology results. Procedure Code(s):     --- Professional ---                        [phone], Colonoscopy, flexible; with endoscopic mucosal                         resection                        [id number], 46, Colonoscopy, flexible; with removal of                         tumor(s), polyp(s), or other lesion(s) by snare                         technique Diagnosis Code(s):     --- Professional ---  Z12.11, Encounter for screening for malignant neoplasm                         of colon                        K63.5, Polyp of colon                        D12.0, Benign neoplasm of cecum CPT copyright 2019 American Medical Association. All rights reserved. The codes documented in this report are preliminary and upon coder review may  be revised to meet current compliance requirements. Dr. Ulyess Mort Lin Landsman MD, MD 05/04/2021 11:55:32 AM This report has been signed electronically. Number of Addenda: 0 Note Initiated On: 05/04/2021 11:19 AM Scope Withdrawal Time: 0 hours 22 minutes 48 seconds  Total Procedure Duration: 0 hours 25 minutes 22 seconds  Estimated Blood Loss:  Estimated blood loss: none.      [facility]

## 2021-05-04 NOTE — Anesthesia Postprocedure Evaluation (Signed)
Anesthesia Post Note  Patient: Martin Wayne [place] Sr.  Procedure(s) Performed: COLONOSCOPY  Patient location during evaluation: Phase II Anesthesia Type: General Level of consciousness: awake and alert, awake and oriented Pain management: pain level controlled Vital Signs Assessment: post-procedure vital signs reviewed and stable Respiratory status: spontaneous breathing, nonlabored ventilation and respiratory function stable Cardiovascular status: blood pressure returned to baseline and stable Postop Assessment: no apparent nausea or vomiting Anesthetic complications: no   No notable events documented.   Last Vitals:  Vitals:   05/04/21 1215 05/04/21 1225  BP: 132/83 (!) 142/85  Pulse: 60 62  Resp: 14 19  Temp:    SpO2: 96% 97%    Last Pain:  Vitals:   05/04/21 1225  TempSrc:   PainSc: 0-No pain                 Phill Mutter

## 2021-05-04 NOTE — H&P (Signed)
  Cephas Darby, MD [address]  [place]  [place], [place] [address]  Main: [phone]  Fax: [phone] Pager: [phone]  Primary Care Physician:  Remi Haggard, FNP Primary Gastroenterologist:  Dr. Cephas Darby  Pre-Procedure History & Physical: HPI:  Martin Gervasi [place] Sr. is a 65 y.o. male is here for an colonoscopy.   Past Medical History:  Diagnosis Date   History of kidney stones    Kidney stones     No past surgical history on file.  Prior to Admission medications   Medication Sig Start Date End Date Taking? Authorizing Provider  carvedilol (COREG) 25 MG tablet Take 25 mg by mouth daily. 04/08/21  Yes [provider]  enalapril-hydrochlorothiazide (VASERETIC) 10-25 MG tablet Take 1 tablet by mouth daily. 01/23/21  Yes [provider]  EQ ASPIRIN ADULT LOW DOSE 81 MG EC tablet Take 81 mg by mouth daily. 01/13/21  Yes [provider]  polyethylene glycol-electrolytes (GAVILYTE-N WITH FLAVOR PACK) 420 g solution Drink one 8 oz glass every 20 mins until entire container is finished starting at 5:00pm on 05/03/21 04/16/21  Yes Donabelle Molden, Tally Due, MD  simvastatin (ZOCOR) 20 MG tablet Take 20 mg by mouth daily. 02/10/21  Yes [provider]  sildenafil (VIAGRA) 100 MG tablet Take 100 mg by mouth once. 04/08/21   [provider]    Allergies as of 04/15/2021   (No Known Allergies)    No family history on file.  Social History   Socioeconomic History   Marital status: Widowed    Spouse name: Not on file   Number of children: Not on file   Years of education: Not on file   Highest education level: Not on file  Occupational History   Not on file  Tobacco Use   Smoking status: Never   Smokeless tobacco: Never  Vaping Use   Vaping Use: Never used  Substance and Sexual Activity   Alcohol use: No   Drug use: Never   Sexual activity: Not on file  Other Topics Concern   Not on file  Social History Narrative    Not on file   Social Determinants of Health   Financial Resource Strain: Not on file  Food Insecurity: Not on file  Transportation Needs: Not on file  Physical Activity: Not on file  Stress: Not on file  Social Connections: Not on file  Intimate Partner Violence: Not on file    Review of Systems: See HPI, otherwise negative ROS  Physical Exam: BP (!) 142/88   Pulse 67   Temp (!) 97.5 F (36.4 C)   Resp 18   Ht 6\' 3"  (1.905 m)   Wt 121.4 kg   SpO2 97%   BMI 33.44 kg/m  General:   Alert,  pleasant and cooperative in NAD Head:  Normocephalic and atraumatic. Neck:  Supple; no masses or thyromegaly. Lungs:  Clear throughout to auscultation.    Heart:  Regular rate and rhythm. Abdomen:  Soft, nontender and nondistended. Normal bowel sounds, without guarding, and without rebound.   Neurologic:  Alert and  oriented x4;  grossly normal neurologically.  Impression/Plan: Martin Wayne [place] Sr. is here for an colonoscopy to be performed for colon cancer screening  Risks, benefits, limitations, and alternatives regarding  colonoscopy have been reviewed with the patient.  Questions have been answered.  All parties agreeable.   Sherri Sear, MD  05/04/2021, 11:11 AM

## 2021-05-04 NOTE — Anesthesia Preprocedure Evaluation (Signed)
Anesthesia Evaluation  Patient identified by MRN, date of birth, ID band Patient awake    Reviewed: Allergy & Precautions, NPO status , Patient's Chart, lab work & pertinent test results, reviewed documented beta blocker date and time   Airway Mallampati: III  TM Distance: >3 FB Neck ROM: Full    Dental no notable dental hx.    Pulmonary neg pulmonary ROS,    Pulmonary exam normal        Cardiovascular hypertension, Pt. on medications and Pt. on home beta blockers + Past MI  Normal cardiovascular exam     Neuro/Psych negative neurological ROS  negative psych ROS   GI/Hepatic negative GI ROS, Neg liver ROS,   Endo/Other  negative endocrine ROS  Renal/GU Renal diseaseKidney Stones  negative genitourinary   Musculoskeletal negative musculoskeletal ROS (+)   Abdominal   Peds negative pediatric ROS (+)  Hematology negative hematology ROS (+)   Anesthesia Other Findings   Reproductive/Obstetrics negative OB ROS                             Anesthesia Physical Anesthesia Plan  ASA: 3  Anesthesia Plan: General   Post-op Pain Management:    Induction: Intravenous  PONV Risk Score and Plan: 2 and Propofol infusion and TIVA  Airway Management Planned: Natural Airway and Nasal Cannula  Additional Equipment:   Intra-op Plan:   Post-operative Plan:   Informed Consent: I have reviewed the patients History and Physical, chart, labs and discussed the procedure including the risks, benefits and alternatives for the proposed anesthesia with the patient or authorized representative who has indicated his/her understanding and acceptance.       Plan Discussed with: CRNA, Anesthesiologist and Surgeon  Anesthesia Plan Comments:         Anesthesia Quick Evaluation

## 2021-05-05 ENCOUNTER — Encounter: Payer: Self-pay | Admitting: Gastroenterology

## 2021-05-05 LAB — SURGICAL PATHOLOGY

## 2021-10-14 ENCOUNTER — Encounter: Payer: Self-pay | Admitting: Urology

## 2021-10-14 ENCOUNTER — Ambulatory Visit (INDEPENDENT_AMBULATORY_CARE_PROVIDER_SITE_OTHER): Payer: BC Managed Care – PPO | Admitting: Urology

## 2021-10-14 ENCOUNTER — Other Ambulatory Visit: Payer: Self-pay

## 2021-10-14 VITALS — BP 143/80 | HR 74 | Ht 75.0 in | Wt 268.0 lb

## 2021-10-14 DIAGNOSIS — R3129 Other microscopic hematuria: Secondary | ICD-10-CM | POA: Diagnosis not present

## 2021-10-14 DIAGNOSIS — R8281 Pyuria: Secondary | ICD-10-CM

## 2021-10-14 DIAGNOSIS — N200 Calculus of kidney: Secondary | ICD-10-CM

## 2021-10-14 LAB — URINALYSIS, COMPLETE
Bilirubin, UA: NEGATIVE
Glucose, UA: NEGATIVE
Nitrite, UA: NEGATIVE
Specific Gravity, UA: 1.02 (ref 1.005–1.030)
Urobilinogen, Ur: 2 mg/dL — ABNORMAL HIGH (ref 0.2–1.0)
pH, UA: 6.5 (ref 5.0–7.5)

## 2021-10-14 LAB — MICROSCOPIC EXAMINATION: RBC, Urine: >30 /hpf — AB (ref 0–2)

## 2021-10-14 NOTE — Progress Notes (Signed)
10/14/2021 1:49 PM   Martin Wayne Costa Zavala Sr. Jun 30, 1956 [id number]  Referring provider: Remi Haggard, [place],  [place] [address]  Chief Complaint  Patient presents with   Hematuria    HPI: Martin Zavala is a 65 y.o. male referred for evaluation of hematuria.  States recent lab work performed by PCP showed hematuria.  UA results were not forwarded for review Did have a urine culture positive for strep agalactiae and currently on nitrofurantoin but was not having symptoms Denies gross hematuria Denies flank, abdominal or pelvic pain History of recurrent stone disease with prior ESWL States he had a CT performed at PCP office approximately 3 months ago Last imaging [facility] was a CT renal stone study performed in 2017 which showed bilateral renal calculi, largest measuring 1.6 cm. Forwarded lab work remarkable for a elevated calcium level 11.0 drawn 09/21/2021  PMH: Past Medical History:  Diagnosis Date   History of kidney stones    Kidney stones     Surgical History: Past Surgical History:  Procedure Laterality Date   COLONOSCOPY N/A 05/04/2021   Procedure: COLONOSCOPY;  Surgeon: Lin Landsman, MD;  Location: [facility] ENDOSCOPY;  Service: Gastroenterology;  Laterality: N/A;    Home Medications:  Allergies as of 10/14/2021   No Known Allergies      Medication List        Accurate as of October 14, 2021  1:49 PM. If you have any questions, ask your nurse or doctor.          carvedilol 25 MG tablet Commonly known as: COREG Take 25 mg by mouth daily.   enalapril-hydrochlorothiazide 10-25 MG tablet Commonly known as: VASERETIC Take 1 tablet by mouth daily.   EQ Aspirin Adult Low Dose 81 MG EC tablet Generic drug: aspirin Take 81 mg by mouth daily.   ezetimibe 10 MG tablet Commonly known as: ZETIA Take 10 mg by mouth daily.   nitrofurantoin 100 MG capsule Commonly known as: MACRODANTIN Take 100 mg by mouth 2 (two) times  daily.   sildenafil 100 MG tablet Commonly known as: VIAGRA Take 100 mg by mouth once.   simvastatin 20 MG tablet Commonly known as: ZOCOR Take 20 mg by mouth daily.        Allergies: No Known Allergies  Family History: History reviewed. No pertinent family history.  Social History:  reports that he has never smoked. He has never used smokeless tobacco. He reports that he does not drink alcohol and does not use drugs.   Physical Exam: BP (!) 143/80   Pulse 74   Ht 6\' 3"  (1.905 m)   Wt 268 lb (121.6 kg)   BMI 33.50 kg/m   Constitutional:  Alert and oriented, No acute distress. HEENT: [place] AT, moist mucus membranes.  Trachea midline, no masses. Cardiovascular: No clubbing, cyanosis, or edema. Respiratory: Normal respiratory effort, no increased work of breathing. GI: Abdomen is soft, nontender, nondistended, no abdominal masses GU: No CVA tenderness Skin: No rashes, bruises or suspicious lesions. Neurologic: Grossly intact, no focal deficits, moving all 4 extremities. Psychiatric: Normal mood and affect.  Laboratory Data:  Urinalysis Dipstick trace ketones/3+ blood/trace protein/1+ leukocytes Microscopy 11-30 WBC/>30 RBC   Assessment & Plan:    1.  Microhematuria UA today with significant microhematuria Last imaging 2017 with bilateral renal calculi Apparently had a recent CT performed at PCP office and will request results Discussed possible need for a CT urogram and possible cystoscopy  2.  Recurrent nephrolithiasis As  above  3.  Pyuria Asymptomatic, urine culture ordered   Abbie Sons, [place] [address], [place], [place] [address] [phone]

## 2021-10-15 LAB — PTH, INTACT AND CALCIUM
Calcium: 10.5 mg/dL — ABNORMAL HIGH (ref 8.6–10.2)
PTH: 41 pg/mL (ref 15–65)

## 2021-10-19 ENCOUNTER — Telehealth: Payer: Self-pay | Admitting: Urology

## 2021-10-19 ENCOUNTER — Telehealth: Payer: Self-pay | Admitting: *Deleted

## 2021-10-19 ENCOUNTER — Other Ambulatory Visit: Payer: Self-pay | Admitting: Urology

## 2021-10-19 DIAGNOSIS — R3129 Other microscopic hematuria: Secondary | ICD-10-CM

## 2021-10-19 LAB — CULTURE, URINE COMPREHENSIVE

## 2021-10-19 NOTE — Telephone Encounter (Signed)
Left message to call back   Sharyn Lull

## 2021-10-19 NOTE — Telephone Encounter (Signed)
Pt LM on triage line stating he is returning a call.

## 2021-10-19 NOTE — Telephone Encounter (Signed)
-----   Message from Abbie Sons, MD sent at 10/18/2021 10:14 AM EST ----- Parathyroid hormone level is normal.  Awaiting copy of his recent CT result from PCP office

## 2021-10-19 NOTE — Telephone Encounter (Signed)
Notified patient as instructed, patient pleased °

## 2021-10-19 NOTE — Telephone Encounter (Signed)
-----   Message from Abbie Sons, MD sent at 10/19/2021 12:46 PM EST ----- PCP office stated they have not ordered a CT scan on patient.  Order was placed for a CT urogram.  Will call with results

## 2021-11-18 ENCOUNTER — Ambulatory Visit
Admission: RE | Admit: 2021-11-18 | Discharge: 2021-11-18 | Disposition: A | Discharge status: Home / Self Care | Payer: BC Managed Care – PPO | Source: Ambulatory Visit | Attending: Urology | Admitting: Urology

## 2021-11-18 ENCOUNTER — Other Ambulatory Visit: Payer: Self-pay

## 2021-11-18 DIAGNOSIS — R3129 Other microscopic hematuria: Secondary | ICD-10-CM | POA: Diagnosis present

## 2021-11-18 LAB — POCT I-STAT CREATININE: Creatinine, Ser: 1.5 mg/dL — ABNORMAL HIGH (ref 0.61–1.24)

## 2021-11-18 MED ORDER — IOHEXOL 350 MG/ML SOLN
100.0000 mL | Freq: Once | INTRAVENOUS | Status: AC | PRN
  Administered 2021-11-18: 16:00:00 100 mL via INTRAVENOUS

## 2021-11-20 ENCOUNTER — Telehealth: Payer: Self-pay | Admitting: *Deleted

## 2021-11-20 NOTE — Telephone Encounter (Signed)
Notified patient as instructed, patient pleased. Patient states he will call back to schedule cysto

## 2021-11-20 NOTE — Telephone Encounter (Signed)
-----   Message from Abbie Sons, MD sent at 11/19/2021  6:48 PM EST ----- CT showed stones in both kidneys.  This may be the source of his blood however to make sure he has no evidence of a bladder tumor or bladder cancer recommend scheduling cystoscopy

## 2021-12-11 ENCOUNTER — Encounter: Payer: Self-pay | Admitting: Urology

## 2021-12-11 ENCOUNTER — Other Ambulatory Visit: Payer: Self-pay

## 2021-12-11 ENCOUNTER — Ambulatory Visit (INDEPENDENT_AMBULATORY_CARE_PROVIDER_SITE_OTHER): Payer: BC Managed Care – PPO | Admitting: Urology

## 2021-12-11 VITALS — BP 169/100 | HR 72 | Ht 75.0 in | Wt 265.0 lb

## 2021-12-11 DIAGNOSIS — R3129 Other microscopic hematuria: Secondary | ICD-10-CM

## 2021-12-11 LAB — URINALYSIS, COMPLETE
Bilirubin, UA: NEGATIVE
Ketones, UA: NEGATIVE
Nitrite, UA: POSITIVE — AB
Specific Gravity, UA: 1.025 (ref 1.005–1.030)
Urobilinogen, Ur: 0.2 mg/dL (ref 0.2–1.0)
pH, UA: 5.5 (ref 5.0–7.5)

## 2021-12-11 LAB — MICROSCOPIC EXAMINATION: RBC, Urine: >30 /hpf — AB (ref 0–2)

## 2021-12-11 MED ORDER — AMOXICILLIN 875 MG PO TABS: 875.0000 mg | ORAL_TABLET | Freq: Two times a day (BID) | ORAL | 0 refills | Status: DC

## 2021-12-11 NOTE — Progress Notes (Signed)
12/11/2021 3:05 PM   Martin Wayne [place] Sr. [birth year] [id number]  Referring provider: Remi Haggard, [place],  [place] [address]  Chief Complaint  Patient presents with   Cysto    HPI: 66 y.o. male seen November 2022 for microhematuria.  CT urogram showed bilateral, nonobstructing renal calculi and not significantly changed from a prior CT of 2017.  This morning he had gross hematuria which she states cleared.  He thinks he may have passed 2 small stones.  He was unable to void for cystoscopy and subsequently voided with grossly bloody urine.  Urine culture at last visit grew strep though he was asymptomatic and not treated.  UA today with 6-10 WBC and >30 RBC.  PMH: Past Medical History:  Diagnosis Date   History of kidney stones    Kidney stones     Surgical History: Past Surgical History:  Procedure Laterality Date   COLONOSCOPY N/A 05/04/2021   Procedure: COLONOSCOPY;  Surgeon: Lin Landsman, MD;  Location: [facility] ENDOSCOPY;  Service: Gastroenterology;  Laterality: N/A;    Home Medications:  Allergies as of 12/11/2021   No Known Allergies      Medication List        Accurate as of December 11, 2021  3:05 PM. If you have any questions, ask your nurse or doctor.          amoxicillin 875 MG tablet Commonly known as: AMOXIL Take 1 tablet (875 mg total) by mouth every 12 (twelve) hours. Started by: Abbie Sons, MD   carvedilol 25 MG tablet Commonly known as: COREG Take 25 mg by mouth daily.   enalapril-hydrochlorothiazide 10-25 MG tablet Commonly known as: VASERETIC Take 1 tablet by mouth daily.   EQ Aspirin Adult Low Dose 81 MG EC tablet Generic drug: aspirin Take 81 mg by mouth daily.   ezetimibe 10 MG tablet Commonly known as: ZETIA Take 10 mg by mouth daily.   nitrofurantoin 100 MG capsule Commonly known as: MACRODANTIN Take 100 mg by mouth 2 (two) times daily.   sildenafil 100 MG tablet Commonly known as:  VIAGRA Take 100 mg by mouth once.   simvastatin 20 MG tablet Commonly known as: ZOCOR Take 20 mg by mouth daily.        Allergies: No Known Allergies  Family History: No family history on file.  Social History:  reports that he has never smoked. He has never used smokeless tobacco. He reports that he does not drink alcohol and does not use drugs.   Physical Exam: BP (!) 169/100    Pulse 72    Ht 6\' 3"  (1.905 m)    Wt 265 lb (120.2 kg)    BMI 33.12 kg/m   Constitutional:  Alert and oriented, No acute distress.   Pertinent Imaging: CT images personally reviewed and interpreted  CT HEMATURIA WORKUP  Narrative CLINICAL DATA:  Microhematuria  EXAM: CT ABDOMEN AND PELVIS WITHOUT AND WITH CONTRAST  TECHNIQUE: Multidetector CT imaging of the abdomen and pelvis was performed following the standard protocol before and following the bolus administration of intravenous contrast.  CONTRAST:  123mL OMNIPAQUE IOHEXOL 350 MG/ML SOLN  COMPARISON:  CT abdomen and pelvis 02/25/2016  FINDINGS: Lower chest: Mild dependent subsegmental atelectasis in the lower lungs.  Hepatobiliary: Liver is normal in size and contour. There are several small scattered hypodensities throughout the liver which appear grossly similar to previous study and most likely represent cysts or hemangiomas, measuring up to 11 mm in  the left lobe. Gallbladder appears normal. No biliary ductal dilatation identified.  Pancreas: Unremarkable. No pancreatic ductal dilatation or surrounding inflammatory changes.  Spleen: Normal in size without focal abnormality.  Adrenals/Urinary Tract: Adrenal glands appear normal. A few renal calculi identified in the right kidney measuring up to 9 mm in the lower pole and 13 x 7 mm in the upper pole. Large early staghorn-like calculus in the lower pole left kidney which measures up to 15 x 9 mm in axial dimensions. No obstructing calculi identified. No hydronephrosis.  Ureters are normal caliber. No enhancing renal mass identified bilaterally. Urinary bladder appears within normal limits.  Stomach/Bowel: No bowel obstruction, free air or pneumatosis. Colonic diverticulosis without evidence of acute diverticulitis. Appendix is normal.  Vascular/Lymphatic: Aortic atherosclerosis. No enlarged abdominal or pelvic lymph nodes.  Reproductive: Prostate gland is mildly enlarged with mild indentation on the base of the urinary bladder.  Other: No ascites.  Small umbilical hernia containing fat.  Musculoskeletal: Degenerative changes of the lumbar spine most significant at L5-S1.  IMPRESSION: 1. Bilateral nonobstructive nephrolithiasis as described. 2. Prostatomegaly. 3. Colonic diverticulosis.   Electronically Signed By: Ofilia Neas M.D. On: 11/18/2021 16:51   Assessment & Plan:    1.  Gross hematuria Urine culture ordered Amoxicillin 875 mg twice daily started pending culture results Reschedule cystoscopy 3-4 weeks  2.  Nephrolithiasis Nonobstructing, bilateral renal calculi stable since 2017. We discussed ureteroscopy and observation.  He has elected the latter   Abbie Sons, MD  [place] [address] [place], [place] [address] [phone]

## 2021-12-15 ENCOUNTER — Telehealth: Payer: Self-pay | Admitting: *Deleted

## 2021-12-15 LAB — CULTURE, URINE COMPREHENSIVE

## 2021-12-15 NOTE — Telephone Encounter (Signed)
Notified patient as instructed, patient pleased. Discussed follow-up appointments, patient agrees  

## 2021-12-15 NOTE — Telephone Encounter (Signed)
-----   Message from Nori Riis, PA-C sent at 12/15/2021  3:11 PM EST ----- Please let Mr. Zenia Resides know that the urine culture was positive for infection and the Augmentin was the appropriate antibiotic and to return for his cystoscopy appointment.

## 2022-01-08 ENCOUNTER — Ambulatory Visit (INDEPENDENT_AMBULATORY_CARE_PROVIDER_SITE_OTHER): Payer: BC Managed Care – PPO | Admitting: Urology

## 2022-01-08 ENCOUNTER — Encounter: Payer: Self-pay | Admitting: Urology

## 2022-01-08 ENCOUNTER — Other Ambulatory Visit: Payer: Self-pay

## 2022-01-08 VITALS — BP 153/92 | HR 75 | Ht 75.0 in | Wt 275.0 lb

## 2022-01-08 DIAGNOSIS — R3129 Other microscopic hematuria: Secondary | ICD-10-CM

## 2022-01-08 DIAGNOSIS — R310 Gross hematuria: Secondary | ICD-10-CM | POA: Diagnosis not present

## 2022-01-08 LAB — URINALYSIS, COMPLETE
Bilirubin, UA: NEGATIVE
Glucose, UA: NEGATIVE
Ketones, UA: NEGATIVE
Nitrite, UA: NEGATIVE
Protein,UA: NEGATIVE
Specific Gravity, UA: 1.015 (ref 1.005–1.030)
Urobilinogen, Ur: 0.2 mg/dL (ref 0.2–1.0)
pH, UA: 6.5 (ref 5.0–7.5)

## 2022-01-08 LAB — MICROSCOPIC EXAMINATION: RBC, Urine: >30 /hpf — ABNORMAL HIGH (ref 0–2)

## 2022-01-08 NOTE — Progress Notes (Signed)
° °  01/08/22  CC:  Chief Complaint  Patient presents with   Cysto    HPI: Refer to my prior note 12/11/2021.  He has no complaints today  See rooming tab for vitals NED. A&Ox3.   No respiratory distress   Abd soft, NT, ND Normal phallus with bilateral descended testicles  Cystoscopy Procedure Note  Patient identification was confirmed, informed consent was obtained, and patient was prepped using Betadine solution.  Lidocaine jelly was administered per urethral meatus.     Pre-Procedure: - Inspection reveals a normal caliber urethral meatus.  Procedure: The flexible cystoscope was introduced without difficulty - No urethral strictures/lesions are present. -Moderate lateral lobe enlargement prostate with hypervascularity/friability -  Small median lobe  bladder neck - Bilateral ureteral orifices identified - Bladder mucosa  reveals no ulcers, tumors, or lesions - No bladder stones - No trabeculation  Retroflexion shows small intravesical median lobe with hypervascularity   Post-Procedure: - Patient tolerated the procedure well  Assessment/ Plan: No bladder tumor or bladder mucosal abnormalities BPH with hypervascularity Bilateral renal calculi Recommend follow-up 6 months with KUB Call earlier for worsening gross hematuria   Abbie Sons, MD

## 2022-01-09 ENCOUNTER — Encounter: Payer: Self-pay | Admitting: Urology

## 2022-07-07 ENCOUNTER — Ambulatory Visit
Admission: RE | Admit: 2022-07-07 | Discharge: 2022-07-07 | Disposition: A | Discharge status: Home / Self Care | Payer: Medicare HMO | Attending: Urology | Admitting: Urology

## 2022-07-07 ENCOUNTER — Encounter: Payer: Self-pay | Admitting: Urology

## 2022-07-07 ENCOUNTER — Ambulatory Visit
Admission: RE | Admit: 2022-07-07 | Discharge: 2022-07-07 | Disposition: A | Discharge status: Home / Self Care | Payer: Medicare HMO | Source: Ambulatory Visit | Attending: Urology | Admitting: Urology

## 2022-07-07 ENCOUNTER — Ambulatory Visit (INDEPENDENT_AMBULATORY_CARE_PROVIDER_SITE_OTHER): Payer: Medicare HMO | Admitting: Urology

## 2022-07-07 VITALS — BP 156/88 | HR 70 | Ht 75.0 in | Wt 259.0 lb

## 2022-07-07 DIAGNOSIS — R310 Gross hematuria: Secondary | ICD-10-CM | POA: Insufficient documentation

## 2022-07-07 DIAGNOSIS — N200 Calculus of kidney: Secondary | ICD-10-CM

## 2022-07-07 NOTE — Progress Notes (Signed)
   07/07/2022 8:34 AM   Martin Wayne [place] Sr. September 06, 1956 [id number]  Referring provider: Remi Haggard, [place],  [place] [address]  Chief Complaint  Patient presents with   Nephrolithiasis    Urologic history: 1.  Hematuria CTU 10/2021 bilateral, nonobstructing renal calculi Cystoscopy 12/2021 moderate lateral lobe enlargement with hypervascularity; small intravesical median lobe  2.  Bilateral nephrolithiasis   HPI: 66 y.o. male presents for 101-monthfollow-up.  Doing well since last visit No bothersome LUTS Denies dysuria, gross hematuria Denies flank, abdominal or pelvic pain  PMH: Past Medical History:  Diagnosis Date   History of kidney stones    Kidney stones     Surgical History: Past Surgical History:  Procedure Laterality Date   COLONOSCOPY N/A 05/04/2021   Procedure: COLONOSCOPY;  Surgeon: VLin Landsman MD;  [facility];  Service: Gastroenterology;  Laterality: N/A;    Home Medications:  Allergies as of 07/07/2022   No Known Allergies      Medication List        Accurate as of July 07, 2022  8:34 AM. If you have any questions, ask your nurse or doctor.          STOP taking these medications    amoxicillin 875 MG tablet Commonly known as: AMOXIL Stopped by: SAbbie Sons MD       TAKE these medications    carvedilol 25 MG tablet Commonly known as: COREG Take 25 mg by mouth daily.   enalapril-hydrochlorothiazide 10-25 MG tablet Commonly known as: VASERETIC Take 1 tablet by mouth daily.   EQ Aspirin Adult Low Dose 81 MG tablet Generic drug: aspirin EC Take 81 mg by mouth daily.   ezetimibe 10 MG tablet Commonly known as: ZETIA Take 10 mg by mouth daily.   nitrofurantoin 100 MG capsule Commonly known as: MACRODANTIN Take 100 mg by mouth 2 (two) times daily.   sildenafil 100 MG tablet Commonly known as: VIAGRA Take 100 mg by mouth once.   simvastatin 20 MG  tablet Commonly known as: ZOCOR Take 20 mg by mouth daily.        Allergies: No Known Allergies  Family History: History reviewed. No pertinent family history.  Social History:  reports that he has never smoked. He has never used smokeless tobacco. He reports that he does not drink alcohol and does not use drugs.   Physical Exam: BP (!) 156/88   Pulse 70   Ht '6\' 3"'$  (1.905 m)   Wt 259 lb (117.5 kg)   BMI 32.37 kg/m   Constitutional:  Alert, No acute distress. HEENT: [place] AT Respiratory: Normal respiratory effort, no increased work of breathing. Neurologic: Grossly intact, no focal deficits, moving all 4 extremities. Psychiatric: Normal mood and affect.   Pertinent Imaging: Images of a KUB performed today were personally reviewed and interpreted.  There has been some interval stone growth bilaterally.  Calculi still in a nonobstructing position    Assessment & Plan:    1.  Bilateral nephrolithiasis Interval stone growth Based on stone size and density would not recommend SWL and stage ureteroscopy would be best treatment options He has elected to continue surveillance and will follow-up in 6 months for repeat KUB If there is continued interval growth on follow-up he is agreeable to ureteroscopy Discussed metabolic evaluation and he elects to pursue    SAbbie Sons [address] [place]ignon[place]Jeffersonville  [phone]

## 2022-07-07 NOTE — Patient Instructions (Signed)

## 2022-07-08 ENCOUNTER — Ambulatory Visit: Payer: BC Managed Care – PPO | Admitting: Urology

## 2022-07-29 ENCOUNTER — Other Ambulatory Visit: Payer: Self-pay | Admitting: *Deleted

## 2022-07-29 DIAGNOSIS — N200 Calculus of kidney: Secondary | ICD-10-CM

## 2022-08-12 ENCOUNTER — Other Ambulatory Visit: Payer: Self-pay | Admitting: Urology

## 2022-08-18 LAB — LITHOLINK 24HR URINE PANEL
Ammonium, Urine: 44 mmol/24 hr (ref 15–60)
Calcium Oxalate Saturation: 6.31 (ref 6.00–10.00)
Calcium Phosphate Saturation: 0.58 (ref 0.50–2.00)
Calcium, Urine: 320 mg/24 hr — ABNORMAL HIGH (ref <250)
Calcium/Creatinine Ratio: 135 mg/g creat (ref 34–196)
Calcium/Kg Body Weight: 2.7 mg/24 hr/kg (ref <4.0)
Chloride, Urine: 302 mmol/24 hr — ABNORMAL HIGH (ref 70–250)
Citrate, Urine: 724 mg/24 hr (ref >450)
Creatinine, Urine: 2375 mg/24 hr
Creatinine/Kg Body Weight: 20.3 mg/24 hr/kg (ref 11.9–24.4)
Magnesium, Urine: 119 mg/24 hr (ref 30–120)
Oxalate, Urine: 30 mg/24 hr (ref 20–40)
Phosphorus, Urine: 1185 mg/24 hr (ref 600–1200)
Potassium, Urine: 51 mmol/24 hr (ref 20–100)
Protein Catabolic Rate: 0.8 g/kg/24 hr (ref 0.8–1.4)
Sodium, Urine: 306 mmol/24 hr — ABNORMAL HIGH (ref 50–150)
Sulfate, Urine: 43 meq/24 hr (ref 20–80)
Urea Nitrogen, Urine: 10.9 g/24 hr (ref 6.00–14.00)
Uric Acid Saturation: 1.95 — ABNORMAL HIGH (ref <1.00)
Uric Acid, Urine: 743 mg/24 hr (ref <800)
Urine Volume (Preserved): 1820 mL/24 hr (ref 500–4000)
pH, 24 hr, Urine: 5.453 — ABNORMAL LOW (ref 5.800–6.200)

## 2022-08-23 ENCOUNTER — Telehealth: Payer: Self-pay | Admitting: *Deleted

## 2022-08-23 NOTE — Telephone Encounter (Signed)
Notified patient as instructed, patient pleased. Discussed follow-up appointments, patient agrees  

## 2022-08-23 NOTE — Telephone Encounter (Signed)
-----   Message from Abbie Sons, MD sent at 08/22/2022  1:50 PM EDT ----- Several abnormalities noted on 24-hour urine study.  Please schedule follow-up visit to discuss results

## 2022-09-07 ENCOUNTER — Emergency Department
Admission: EM | Admit: 2022-09-07 | Discharge: 2022-09-07 | Disposition: A | Discharge status: Home / Self Care | Payer: Medicare HMO | Attending: Student in an Organized Health Care Education/Training Program | Admitting: Student in an Organized Health Care Education/Training Program

## 2022-09-07 ENCOUNTER — Other Ambulatory Visit: Payer: Self-pay

## 2022-09-07 ENCOUNTER — Emergency Department: Payer: Medicare HMO

## 2022-09-07 ENCOUNTER — Encounter: Payer: Self-pay | Admitting: Emergency Medicine

## 2022-09-07 DIAGNOSIS — R109 Unspecified abdominal pain: Secondary | ICD-10-CM | POA: Insufficient documentation

## 2022-09-07 DIAGNOSIS — R110 Nausea: Secondary | ICD-10-CM | POA: Diagnosis not present

## 2022-09-07 DIAGNOSIS — Z87442 Personal history of urinary calculi: Secondary | ICD-10-CM | POA: Diagnosis not present

## 2022-09-07 LAB — URINALYSIS, ROUTINE W REFLEX MICROSCOPIC
Bacteria, UA: NONE SEEN
Bilirubin Urine: NEGATIVE
Glucose, UA: NEGATIVE mg/dL
Ketones, ur: NEGATIVE mg/dL
Nitrite: NEGATIVE
Protein, ur: 30 mg/dL — AB
RBC / HPF: >50 RBC/hpf — ABNORMAL HIGH (ref 0–5)
Specific Gravity, Urine: 1.018 (ref 1.005–1.030)
WBC, UA: >50 WBC/hpf — ABNORMAL HIGH (ref 0–5)
pH: 5 (ref 5.0–8.0)

## 2022-09-07 LAB — COMPREHENSIVE METABOLIC PANEL
ALT: 12 U/L (ref 0–44)
AST: 18 U/L (ref 15–41)
Albumin: 4.2 g/dL (ref 3.5–5.0)
Alkaline Phosphatase: 58 U/L (ref 38–126)
Anion gap: 5 (ref 5–15)
BUN: 20 mg/dL (ref 8–23)
CO2: 26 mmol/L (ref 22–32)
Calcium: 10.3 mg/dL (ref 8.9–10.3)
Chloride: 106 mmol/L (ref 98–111)
Creatinine, Ser: 1.17 mg/dL (ref 0.61–1.24)
GFR, Estimated: >60 mL/min (ref >60)
Glucose, Bld: 131 mg/dL — ABNORMAL HIGH (ref 70–99)
Potassium: 3.8 mmol/L (ref 3.5–5.1)
Sodium: 137 mmol/L (ref 135–145)
Total Bilirubin: 0.7 mg/dL (ref 0.3–1.2)
Total Protein: 7.6 g/dL (ref 6.5–8.1)

## 2022-09-07 LAB — CBC
HCT: 44.3 % (ref 39.0–52.0)
Hemoglobin: 14.5 g/dL (ref 13.0–17.0)
MCH: 28.9 pg (ref 26.0–34.0)
MCHC: 32.7 g/dL (ref 30.0–36.0)
MCV: 88.4 fL (ref 80.0–100.0)
Platelets: 205 10*3/uL (ref 150–400)
RBC: 5.01 MIL/uL (ref 4.22–5.81)
RDW: 12.7 % (ref 11.5–15.5)
WBC: 7.4 10*3/uL (ref 4.0–10.5)
nRBC: 0 % (ref 0.0–0.2)

## 2022-09-07 MED ORDER — OXYCODONE-ACETAMINOPHEN 5-325 MG PO TABS: 1.0000 | ORAL_TABLET | ORAL | 0 refills | Status: DC | PRN

## 2022-09-07 MED ORDER — OXYCODONE-ACETAMINOPHEN 5-325 MG PO TABS
1.0000 | ORAL_TABLET | Freq: Once | ORAL | Status: AC
  Administered 2022-09-07: 1 via ORAL
  Filled 2022-09-07: qty 1

## 2022-09-07 MED ORDER — ONDANSETRON 4 MG PO TBDP: 4.0000 mg | ORAL_TABLET | Freq: Three times a day (TID) | ORAL | 0 refills | Status: DC | PRN

## 2022-09-07 MED ORDER — KETOROLAC TROMETHAMINE 30 MG/ML IJ SOLN
30.0000 mg | Freq: Once | INTRAMUSCULAR | Status: AC
  Administered 2022-09-07: 30 mg via INTRAMUSCULAR
  Filled 2022-09-07: qty 1

## 2022-09-07 MED ORDER — MORPHINE SULFATE (PF) 4 MG/ML IV SOLN
6.0000 mg | INTRAVENOUS | Status: DC | PRN
  Administered 2022-09-07: 6 mg via INTRAMUSCULAR
  Filled 2022-09-07: qty 2

## 2022-09-07 NOTE — ED Triage Notes (Signed)
Pt  in with co right flank pain that started a few days ago but worsened this am. Pt has hx of kidney stones, states feels the same. Pt has had hematuria.

## 2022-09-07 NOTE — ED Provider Notes (Signed)
[facility] Provider Note    Event Date/Time   First MD Initiated Contact with Patient 09/07/22 1113     (approximate)   History   Flank Pain   HPI  Martin Delbene [place] Sr. is a 66 y.o. male with a history of bilateral kidney stones having required lithotripsy procedural intervention in the past presents to the ER for 1 day of acute right flank pain becoming more severe this morning.  No fevers.  Is having nausea secondary to the pain.     Physical Exam   Triage Vital Signs: ED Triage Vitals [09/07/22 0930]  Enc Vitals Group     BP 130/81     Pulse Rate 74     Resp 18     Temp 98.5 F (36.9 C)     Temp Source Oral     SpO2 96 %     Weight 266 lb (120.7 kg)     Height '6\' 3"'$  (1.905 m)     Head Circumference      Peak Flow      Pain Score 10     Pain Loc      Pain Edu?      Excl. in Hurt?     Most recent vital signs: Vitals:   09/07/22 0930  BP: 130/81  Pulse: 74  Resp: 18  Temp: 98.5 F (36.9 C)  SpO2: 96%     Constitutional: Alert  Eyes: Conjunctivae are normal.  Head: Atraumatic. Nose: No congestion/rhinnorhea. Mouth/Throat: Mucous membranes are moist.   Neck: Painless ROM.  Cardiovascular:   Good peripheral circulation. Respiratory: Normal respiratory effort.  No retractions.  Gastrointestinal: Soft and nontender.  Musculoskeletal:  no deformity Neurologic:  MAE spontaneously. No gross focal neurologic deficits are appreciated.  Skin:  Skin is warm, dry and intact. No rash noted. Psychiatric: Mood and affect are normal. Speech and behavior are normal.    ED Results / Procedures / Treatments   Labs (all labs ordered are listed, but only abnormal results are displayed) Labs Reviewed  COMPREHENSIVE METABOLIC PANEL - Abnormal; Notable for the following components:      Result Value   Glucose, Bld 131 (*)    All other components within normal limits  URINALYSIS, ROUTINE W REFLEX MICROSCOPIC - Abnormal; Notable for the  following components:   Color, Urine YELLOW (*)    APPearance HAZY (*)    Hgb urine dipstick LARGE (*)    Protein, ur 30 (*)    Leukocytes,Ua MODERATE (*)    RBC / HPF >50 (*)    WBC, UA >50 (*)    All other components within normal limits  CBC     EKG     RADIOLOGY Please see ED Course for my review and interpretation.  I personally reviewed all radiographic images ordered to evaluate for the above acute complaints and reviewed radiology reports and findings.  These findings were personally discussed with the patient.  Please see medical record for radiology report.    PROCEDURES:  Critical Care performed:   Procedures   MEDICATIONS ORDERED IN ED: Medications  morphine (PF) 4 MG/ML injection 6 mg (6 mg Intramuscular Given 09/07/22 1136)  oxyCODONE-acetaminophen (PERCOCET/ROXICET) 5-325 MG per tablet 1 tablet (1 tablet Oral Given 09/07/22 1136)  ketorolac (TORADOL) 30 MG/ML injection 30 mg (30 mg Intramuscular Given 09/07/22 1201)     IMPRESSION / MDM / ASSESSMENT AND PLAN / ED COURSE  I reviewed the triage vital signs and the  nursing notes.                              Differential diagnosis includes, but is not limited to, stone, colitis, musculoskeletal strain, shingles, pyelonephritis, diverticulitis, appendicitis, AAA  Patient presenting to the ER for evaluation of symptoms as described above.  Based on symptoms, risk factors and considered above differential, this presenting complaint could reflect a potentially life-threatening illness therefore the patient will be placed on continuous pulse oximetry and telemetry for monitoring.  Laboratory evaluation will be sent to evaluate for the above complaints.  CT imaging ordered for the but differential on my review and interpretation shows evidence of bilateral nephrolithiasis.  We will give IM and p.o. pain medication.    Clinical Course as of 09/07/22 1223  Tue Sep 07, 2022  1153 Urinalysis without any evidence  of infection.  Will give Toradol observe for pain control.  Will require close outpatient follow-up. [PR]    Clinical Course User Index [PR] Merlyn Lot, MD    FINAL CLINICAL IMPRESSION(S) / ED DIAGNOSES   Final diagnoses:  Right flank pain     Rx / DC Orders   ED Discharge Orders          Ordered    oxyCODONE-acetaminophen (PERCOCET) 5-325 MG tablet  Every 4 hours PRN        09/07/22 1203    ondansetron (ZOFRAN-ODT) 4 MG disintegrating tablet  Every 8 hours PRN        09/07/22 1203             Note:  This document was prepared using Dragon voice recognition software and may include unintentional dictation errors.    Merlyn Lot, MD 09/07/22 [phone]

## 2022-09-08 ENCOUNTER — Ambulatory Visit: Payer: Medicare HMO | Admitting: Urology

## 2022-09-08 ENCOUNTER — Encounter: Payer: Self-pay | Admitting: Urology

## 2022-09-08 VITALS — BP 137/83 | HR 61 | Ht 75.0 in | Wt 256.0 lb

## 2022-09-08 DIAGNOSIS — N23 Unspecified renal colic: Secondary | ICD-10-CM | POA: Diagnosis not present

## 2022-09-08 DIAGNOSIS — N200 Calculus of kidney: Secondary | ICD-10-CM

## 2022-09-08 LAB — URINALYSIS, COMPLETE
Bilirubin, UA: NEGATIVE
Glucose, UA: NEGATIVE
Ketones, UA: NEGATIVE
Nitrite, UA: NEGATIVE
Specific Gravity, UA: 1.03 (ref 1.005–1.030)
Urobilinogen, Ur: 1 mg/dL (ref 0.2–1.0)
pH, UA: 5 (ref 5.0–7.5)

## 2022-09-08 LAB — MICROSCOPIC EXAMINATION: RBC, Urine: >30 /hpf — AB (ref 0–2)

## 2022-09-08 NOTE — H&P (View-Only) (Signed)
09/08/2022 8:41 AM   Savior Wayne [place] Sr. 1956-07-27 [id number]  Referring provider: Remi Haggard, [place],  [place] [address]  Chief Complaint  Patient presents with   Nephrolithiasis    Urologic history: 1.  Hematuria CTU 10/2021 bilateral, nonobstructing renal calculi Cystoscopy 12/2021 moderate lateral lobe enlargement with hypervascularity; small intravesical median lobe  2.  Bilateral nephrolithiasis   HPI: 66 y.o. male presents for follow-up visit.  Recent 24-hour urine study showed several abnormalities including low urine volume and hypercalciuria.  He was asked to come in to discuss the results and management options Presented to the ED yesterday with right renal colic and CT showed migration of one of his right-sided calculi to the renal pelvis measuring 13 mm.  Stable 9 mm right lower pole calculus as well as nonobstructing left renal calculus Pain was controlled with parenteral analgesics and only mild discomfort today 24-hour urine study remarkable for low urine volume at 1820 mL; hypercalciuria at 320 mg; low urine pH 5.4; elevated uric acid supersaturation 1.95; elevated sodium 306 mmol  PMH: Past Medical History:  Diagnosis Date   History of kidney stones    Kidney stones     Surgical History: Past Surgical History:  Procedure Laterality Date   COLONOSCOPY N/A 05/04/2021   Procedure: COLONOSCOPY;  Surgeon: Lin Landsman, MD;  Location: [place];  Service: Gastroenterology;  Laterality: N/A;    Home Medications:  Allergies as of 09/08/2022   No Known Allergies      Medication List        Accurate as of September 08, 2022  8:41 AM. If you have any questions, ask your nurse or doctor.          carvedilol 25 MG tablet Commonly known as: COREG Take 25 mg by mouth daily.   enalapril-hydrochlorothiazide 10-25 MG tablet Commonly known as: VASERETIC Take 1 tablet by mouth daily.   EQ Aspirin Adult Low  Dose 81 MG tablet Generic drug: aspirin EC Take 81 mg by mouth daily.   ezetimibe 10 MG tablet Commonly known as: ZETIA Take 10 mg by mouth daily.   nitrofurantoin 100 MG capsule Commonly known as: MACRODANTIN Take 100 mg by mouth 2 (two) times daily.   ondansetron 4 MG disintegrating tablet Commonly known as: ZOFRAN-ODT Take 1 tablet (4 mg total) by mouth every 8 (eight) hours as needed for nausea or vomiting.   oxyCODONE-acetaminophen 5-325 MG tablet Commonly known as: Percocet Take 1 tablet by mouth every 4 (four) hours as needed for severe pain.   sildenafil 100 MG tablet Commonly known as: VIAGRA Take 100 mg by mouth once.   simvastatin 20 MG tablet Commonly known as: ZOCOR Take 20 mg by mouth daily.        Allergies: No Known Allergies  Family History: History reviewed. No pertinent family history.  Social History:  reports that he has never smoked. He has never used smokeless tobacco. He reports that he does not drink alcohol and does not use drugs.   Physical Exam: BP 137/83   Pulse 61   Ht '6\' 3"'$  (1.905 m)   Wt 256 lb (116.1 kg)   BMI 32.00 kg/m   Constitutional:  Alert, No acute distress. HEENT: [place] AT Respiratory: Normal respiratory effort, no increased work of breathing. Neurologic: Grossly intact, no focal deficits, moving all 4 extremities. Psychiatric: Normal mood and affect.   Pertinent Imaging: CT images were personally reviewed and interpreted    Assessment & Plan:  1.  Renal colic Secondary to migration of a 13 mm calculus to the renal pelvis Stone density 1600 HU We discussed various treatment options for urolithiasis including observation with or without medical expulsive therapy, shockwave lithotripsy (SWL), ureteroscopy and laser lithotripsy with stent placement. We discussed that management is based on stone size, location, density, patient co-morbidities, and patient preference.  Stones <39m in size have a >80% spontaneous  passage rate. Data surrounding the use of tamsulosin for medical expulsive therapy is controversial, but meta analyses suggests it is most efficacious for distal stones between 5-149min size. Possible side effects include dizziness/lightheadedness, and retrograde ejaculation. SWL has a lower stone free rate in a single procedure, but also a lower complication rate compared to ureteroscopy and avoids a stent and associated stent related symptoms. Possible complications include renal hematoma, steinstrasse, and need for additional treatment. Ureteroscopy with laser lithotripsy and stent placement has a higher stone free rate than SWL in a single procedure, however increased complication rate including possible infection, ureteral injury, bleeding, and stent related morbidity. Common stent related symptoms include dysuria, urgency/frequency, and flank pain. We discussed due to stone hardness SWL unlikely to resolve his stone in 1 treatment and only the 13 mm calculus can be treated After an extensive discussion of the risks and benefits of the above treatment options, the patient would like to proceed with right ureteroscopic stone removal.  We discussed the possible need for a staged procedure and that in a small percentage of cases the kidney cannot be accessed with ureteroscope due to ureteral anatomy and he would require stent placement and follow-up ureteroscopy.  All questions were answered and he desires to proceed.    ScAbbie SonsMDRockwood[address]New Athens[place] [phone]

## 2022-09-08 NOTE — Progress Notes (Unsigned)
09/08/2022 8:41 AM   Ala Dach [place] Sr. 11-04-1956 [id number]  Referring provider: Armando Gang, FNP [address] [place],  [place] [address]  Chief Complaint  Patient presents with   Nephrolithiasis    Urologic history: 1.  Hematuria CTU 10/2021 bilateral, nonobstructing renal calculi Cystoscopy 12/2021 moderate lateral lobe enlargement with hypervascularity; small intravesical median lobe  2.  Bilateral nephrolithiasis   HPI: 66 y.o. male presents for follow-up visit.  Recent 24-hour urine study showed several abnormalities including low urine volume and hypercalciuria.  He was asked to come in to discuss the results and management options Presented to the ED yesterday with right renal colic and CT showed migration of one of his right-sided calculi to the renal pelvis measuring 13 mm.  Stable 9 mm right lower pole calculus as well as nonobstructing left renal calculus Pain was controlled with parenteral analgesics and only mild discomfort today 24-hour urine study remarkable for low urine volume at 1820 mL; hypercalciuria at 320 mg; low urine pH 5.4; elevated uric acid supersaturation 1.95; elevated sodium 306 mmol  PMH: Past Medical History:  Diagnosis Date   History of kidney stones    Kidney stones     Surgical History: Past Surgical History:  Procedure Laterality Date   COLONOSCOPY N/A 05/04/2021   Procedure: COLONOSCOPY;  Surgeon: Toney Reil, MD;  Location: [facility];  Service: Gastroenterology;  Laterality: N/A;    Home Medications:  Allergies as of 09/08/2022   No Known Allergies      Medication List        Accurate as of September 08, 2022  8:41 AM. If you have any questions, ask your nurse or doctor.          carvedilol 25 MG tablet Commonly known as: COREG Take 25 mg by mouth daily.   enalapril-hydrochlorothiazide 10-25 MG tablet Commonly known as: VASERETIC Take 1 tablet by mouth daily.   EQ Aspirin Adult Low  Dose 81 MG tablet Generic drug: aspirin EC Take 81 mg by mouth daily.   ezetimibe 10 MG tablet Commonly known as: ZETIA Take 10 mg by mouth daily.   nitrofurantoin 100 MG capsule Commonly known as: MACRODANTIN Take 100 mg by mouth 2 (two) times daily.   ondansetron 4 MG disintegrating tablet Commonly known as: ZOFRAN-ODT Take 1 tablet (4 mg total) by mouth every 8 (eight) hours as needed for nausea or vomiting.   oxyCODONE-acetaminophen 5-325 MG tablet Commonly known as: Percocet Take 1 tablet by mouth every 4 (four) hours as needed for severe pain.   sildenafil 100 MG tablet Commonly known as: VIAGRA Take 100 mg by mouth once.   simvastatin 20 MG tablet Commonly known as: ZOCOR Take 20 mg by mouth daily.        Allergies: No Known Allergies  Family History: History reviewed. No pertinent family history.  Social History:  reports that he has never smoked. He has never used smokeless tobacco. He reports that he does not drink alcohol and does not use drugs.   Physical Exam: BP 137/83   Pulse 61   Ht 6\' 3"  (1.905 m)   Wt 256 lb (116.1 kg)   BMI 32.00 kg/m   Constitutional:  Alert, No acute distress. HEENT: [place] AT Respiratory: Normal respiratory effort, no increased work of breathing. Neurologic: Grossly intact, no focal deficits, moving all 4 extremities. Psychiatric: Normal mood and affect.   Pertinent Imaging: CT images were personally reviewed and interpreted    Assessment & Plan:  1.  Renal colic Secondary to migration of a 13 mm calculus to the renal pelvis Stone density 1600 HU We discussed various treatment options for urolithiasis including observation with or without medical expulsive therapy, shockwave lithotripsy (SWL), ureteroscopy and laser lithotripsy with stent placement. We discussed that management is based on stone size, location, density, patient co-morbidities, and patient preference.  Stones <70mm in size have a >80% spontaneous  passage rate. Data surrounding the use of tamsulosin for medical expulsive therapy is controversial, but meta analyses suggests it is most efficacious for distal stones between 5-17mm in size. Possible side effects include dizziness/lightheadedness, and retrograde ejaculation. SWL has a lower stone free rate in a single procedure, but also a lower complication rate compared to ureteroscopy and avoids a stent and associated stent related symptoms. Possible complications include renal hematoma, steinstrasse, and need for additional treatment. Ureteroscopy with laser lithotripsy and stent placement has a higher stone free rate than SWL in a single procedure, however increased complication rate including possible infection, ureteral injury, bleeding, and stent related morbidity. Common stent related symptoms include dysuria, urgency/frequency, and flank pain. We discussed due to stone hardness SWL unlikely to resolve his stone in 1 treatment and only the 13 mm calculus can be treated After an extensive discussion of the risks and benefits of the above treatment options, the patient would like to proceed with right ureteroscopic stone removal.  We discussed the possible need for a staged procedure and that in a small percentage of cases the kidney cannot be accessed with ureteroscope due to ureteral anatomy and he would require stent placement and follow-up ureteroscopy.  All questions were answered and he desires to proceed.  ***Melissa orders     Riki Altes, MD  [facility] [address] [place], [place] [address] [phone]

## 2022-09-09 ENCOUNTER — Encounter: Payer: Self-pay | Admitting: Urology

## 2022-09-09 ENCOUNTER — Other Ambulatory Visit: Payer: Self-pay | Admitting: *Deleted

## 2022-09-09 ENCOUNTER — Telehealth: Payer: Self-pay | Admitting: *Deleted

## 2022-09-09 ENCOUNTER — Other Ambulatory Visit: Payer: Self-pay | Admitting: Urology

## 2022-09-09 ENCOUNTER — Telehealth: Payer: Self-pay

## 2022-09-09 DIAGNOSIS — N201 Calculus of ureter: Secondary | ICD-10-CM

## 2022-09-09 MED ORDER — METRONIDAZOLE 500 MG PO TABS: ORAL_TABLET | ORAL | 0 refills | Status: DC

## 2022-09-09 NOTE — Telephone Encounter (Signed)
I spoke with Martin Zavala. We have discussed possible surgery dates and Tuesday October 24th, 2023 was agreed upon by all parties. Patient given information about surgery date, what to expect pre-operatively and post operatively.  We discussed that a Pre-Admission Testing office will be calling to set up the pre-op visit that will take place prior to surgery, and that these appointments are typically done over the phone with a Pre-Admissions RN.  Informed patient that our office will communicate any additional care to be provided after surgery. Patients questions or concerns were discussed during our call. Advised to call our office should there be any additional information, questions or concerns that arise. Patient verbalized understanding.

## 2022-09-09 NOTE — Progress Notes (Signed)
Surgical Physician Order Form [facility] [place]  * Scheduling expectation : Next Available  *Length of Case: 2 hours  *Clearance needed: no  *Anticoagulation Instructions: N/A  *Aspirin Instructions: Hold Aspirin  *Post-op visit Date/Instructions:  1 week cysto stent removal  *Diagnosis: Right UPJ Stone  *Procedure: Right Ureteroscopy w/laser lithotripsy & stent placement (88875)   Additional orders: N/A  -Admit type: OUTpatient  -Anesthesia: Spinal  -VTE Prophylaxis Standing Order SCD's       Other:   -Standing Lab Orders Per Anesthesia    Lab other: UA&Urine Culture ordered 10/18  -Standing Test orders EKG/Chest x-ray per Anesthesia       Test other:   - Medications:  Gentamicin per pharmacy  -Other orders:  N/A

## 2022-09-09 NOTE — Progress Notes (Signed)
[facility] Posting Form   Surgery Date/Time: Date: 09/14/2022  Surgeon: Dr. John Giovanni, MD  Surgery Location: Day Surgery  Inpt ( No  )   Outpt (Yes)   Obs ( No  )   Diagnosis: N20.1 Right Ureteropelvic Junction Stone  -CPT: [phone]  Surgery: Right Ureteroscopy with laser lithotripsy and stent placement   Stop Anticoagulations: N/A, hold ASA  Cardiac/Medical/Pulmonary Clearance needed: no  *Orders entered into EPIC  Date: 09/09/22   *Case booked in EPIC  Date: 09/09/22  *Notified pt of Surgery: Date: 09/09/22  PRE-OP UA & CX: yes, obtained in clinic yesterday 09/08/2022  *Placed into Prior Authorization Work Fabio Bering Date: 09/09/22   Assistant/laser/rep:No

## 2022-09-09 NOTE — Telephone Encounter (Signed)
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  Send in Rx metronidazole 2 g as a single dose.  This can be transmitted sexually and recommend he contact his sexual partner to be evaluated by their PCP

## 2022-09-09 NOTE — Telephone Encounter (Signed)
Notified patient as instructed,.  

## 2022-09-12 LAB — CULTURE, URINE COMPREHENSIVE

## 2022-09-13 ENCOUNTER — Other Ambulatory Visit: Payer: Self-pay

## 2022-09-13 ENCOUNTER — Encounter
Admission: RE | Admit: 2022-09-13 | Discharge: 2022-09-13 | Disposition: A | Discharge status: Home / Self Care | Payer: Medicare HMO | Source: Ambulatory Visit | Attending: Urology | Admitting: Urology

## 2022-09-13 HISTORY — DX: Essential (primary) hypertension: I10

## 2022-09-13 NOTE — Patient Instructions (Addendum)
Your procedure is scheduled on: 09/14/22 - Tuesday Report to the Registration Desk on the 1st floor of the [facility]. To find out your arrival time, please call [phone] between 1PM - 3PM on: 09/13/22 - Monday If your arrival time is 6:00 am, do not arrive prior to that time as the [facility] entrance doors do not open until 6:00 am.  REMEMBER: Instructions that are not followed completely may result in serious medical risk, up to and including death; or upon the discretion of your surgeon and anesthesiologist your surgery may need to be rescheduled.  Do not eat food or drink any fluids after midnight the night before surgery.  No gum chewing, lozengers or hard candies.  TAKE THESE MEDICATIONS THE MORNING OF SURGERY WITH A SIP OF WATER:  - carvedilol (COREG)  - ezetimibe (ZETIA)  - simvastatin (ZOCOR)  HOLD Aspirin before your procedure.  One week prior to surgery: Stop Anti-inflammatories (NSAIDS) such as Advil, Aleve, Ibuprofen, Motrin, Naproxen, Naprosyn and Aspirin based products such as Excedrin, Goodys Powder, BC Powder.  Stop ANY OVER THE COUNTER supplements until after surgery.  You may however, continue to take Tylenol if needed for pain up until the day of surgery.  No Alcohol for 24 hours before or after surgery.  No Smoking including e-cigarettes for 24 hours prior to surgery.  No chewable tobacco products for at least 6 hours prior to surgery.  No nicotine patches on the day of surgery.  Do not use any "recreational" drugs for at least a week prior to your surgery.  Please be advised that the combination of cocaine and anesthesia may have negative outcomes, up to and including death. If you test positive for cocaine, your surgery will be cancelled.  On the morning of surgery brush your teeth with toothpaste and water, you may rinse your mouth with mouthwash if you wish. Do not swallow any toothpaste or mouthwash.  Do not wear jewelry, make-up, hairpins,  clips or nail polish.  Do not wear lotions, powders, or perfumes.   Do not shave body from the neck down 48 hours prior to surgery just in case you cut yourself which could leave a site for infection.  Also, freshly shaved skin may become irritated if using the CHG soap.  Contact lenses, hearing aids and dentures may not be worn into surgery.  Do not bring valuables to the hospital. [facility] is not responsible for any missing/lost belongings or valuables.   Notify your doctor if there is any change in your medical condition (cold, fever, infection).  Wear comfortable clothing (specific to your surgery type) to the hospital.  After surgery, you can help prevent lung complications by doing breathing exercises.  Take deep breaths and cough every 1-2 hours. Your doctor may order a device called an Incentive Spirometer to help you take deep breaths. When coughing or sneezing, hold a pillow firmly against your incision with both hands. This is called "splinting." Doing this helps protect your incision. It also decreases belly discomfort.  If you are being admitted to the hospital overnight, leave your suitcase in the car. After surgery it may be brought to your room.  If you are being discharged the day of surgery, you will not be allowed to drive home. You will need a responsible adult (18 years or older) to drive you home and stay with you that night.   If you are taking public transportation, you will need to have a responsible adult (18 years  or older) with you. Please confirm with your physician that it is acceptable to use public transportation.   Please call the [facility]. at [phone] if you have any questions about these instructions.  Surgery Visitation Policy:  Patients undergoing a surgery or procedure may have two family members or support persons with them as long as the person is not COVID-19 positive or experiencing its symptoms.   Inpatient  Visitation:    Visiting hours are 7 a.m. to 8 p.m. Up to four visitors are allowed at one time in a patient room, including children. The visitors may rotate out with other people during the day. One designated support person (adult) may remain overnight.

## 2022-09-14 ENCOUNTER — Encounter
Admission: RE | Disposition: A | Discharge status: Home / Self Care | Payer: Self-pay | Source: Ambulatory Visit | Attending: Urology

## 2022-09-14 ENCOUNTER — Ambulatory Visit: Payer: Medicare HMO | Admitting: Certified Registered"

## 2022-09-14 ENCOUNTER — Other Ambulatory Visit: Payer: Self-pay

## 2022-09-14 ENCOUNTER — Encounter: Payer: Self-pay | Admitting: Urology

## 2022-09-14 ENCOUNTER — Ambulatory Visit: Payer: Medicare HMO

## 2022-09-14 ENCOUNTER — Ambulatory Visit
Admission: RE | Admit: 2022-09-14 | Discharge: 2022-09-14 | Disposition: A | Discharge status: Home / Self Care | Payer: Medicare HMO | Source: Ambulatory Visit | Attending: Urology | Admitting: Urology

## 2022-09-14 DIAGNOSIS — Z79899 Other long term (current) drug therapy: Secondary | ICD-10-CM | POA: Diagnosis not present

## 2022-09-14 DIAGNOSIS — I10 Essential (primary) hypertension: Secondary | ICD-10-CM | POA: Diagnosis not present

## 2022-09-14 DIAGNOSIS — Z419 Encounter for procedure for purposes other than remedying health state, unspecified: Secondary | ICD-10-CM

## 2022-09-14 DIAGNOSIS — Z6832 Body mass index (BMI) 32.0-32.9, adult: Secondary | ICD-10-CM | POA: Diagnosis not present

## 2022-09-14 DIAGNOSIS — N200 Calculus of kidney: Secondary | ICD-10-CM | POA: Insufficient documentation

## 2022-09-14 DIAGNOSIS — I252 Old myocardial infarction: Secondary | ICD-10-CM | POA: Insufficient documentation

## 2022-09-14 DIAGNOSIS — N201 Calculus of ureter: Secondary | ICD-10-CM

## 2022-09-14 HISTORY — PX: CYSTOSCOPY W/ RETROGRADES: SHX1426

## 2022-09-14 HISTORY — PX: CYSTOSCOPY/URETEROSCOPY/HOLMIUM LASER/STENT PLACEMENT: SHX6546

## 2022-09-14 SURGERY — CYSTOSCOPY/URETEROSCOPY/HOLMIUM LASER/STENT PLACEMENT
Anesthesia: General | Laterality: Right

## 2022-09-14 MED ORDER — CHLORHEXIDINE GLUCONATE 0.12 % MT SOLN: 15.0000 mL | Freq: Once | OROMUCOSAL | Status: AC

## 2022-09-14 MED ORDER — FENTANYL CITRATE (PF) 100 MCG/2ML IJ SOLN
INTRAMUSCULAR | Status: AC
  Filled 2022-09-14: qty 2

## 2022-09-14 MED ORDER — FENTANYL CITRATE (PF) 100 MCG/2ML IJ SOLN: 25.0000 ug | INTRAMUSCULAR | Status: DC | PRN

## 2022-09-14 MED ORDER — PROPOFOL 10 MG/ML IV BOLUS
INTRAVENOUS | Status: AC
  Filled 2022-09-14: qty 20

## 2022-09-14 MED ORDER — SODIUM CHLORIDE 0.9 % IR SOLN
Status: DC | PRN
  Administered 2022-09-14: 4000 mL via INTRAVESICAL

## 2022-09-14 MED ORDER — EPHEDRINE 5 MG/ML INJ
INTRAVENOUS | Status: AC
  Filled 2022-09-14: qty 5

## 2022-09-14 MED ORDER — DEXAMETHASONE SODIUM PHOSPHATE 10 MG/ML IJ SOLN
INTRAMUSCULAR | Status: DC | PRN
  Administered 2022-09-14: 10 mg via INTRAVENOUS

## 2022-09-14 MED ORDER — GENTAMICIN SULFATE 40 MG/ML IJ SOLN
5.0000 mg/kg | INTRAVENOUS | Status: AC
  Administered 2022-09-14: 580 mg via INTRAVENOUS
  Filled 2022-09-14: qty 14.5

## 2022-09-14 MED ORDER — PROPOFOL 10 MG/ML IV BOLUS
INTRAVENOUS | Status: DC | PRN
  Administered 2022-09-14: 200 mg via INTRAVENOUS

## 2022-09-14 MED ORDER — MIDAZOLAM HCL 2 MG/2ML IJ SOLN
INTRAMUSCULAR | Status: AC
  Filled 2022-09-14: qty 2

## 2022-09-14 MED ORDER — IOHEXOL 180 MG/ML  SOLN
INTRAMUSCULAR | Status: DC | PRN
  Administered 2022-09-14 (×2): 10 mL

## 2022-09-14 MED ORDER — EPHEDRINE SULFATE (PRESSORS) 50 MG/ML IJ SOLN
INTRAMUSCULAR | Status: DC | PRN
  Administered 2022-09-14: 10 mg via INTRAVENOUS
  Administered 2022-09-14: 5 mg via INTRAVENOUS

## 2022-09-14 MED ORDER — SUGAMMADEX SODIUM 500 MG/5ML IV SOLN
INTRAVENOUS | Status: AC
  Filled 2022-09-14: qty 5

## 2022-09-14 MED ORDER — LIDOCAINE HCL (PF) 2 % IJ SOLN
INTRAMUSCULAR | Status: AC
  Filled 2022-09-14: qty 5

## 2022-09-14 MED ORDER — ROCURONIUM BROMIDE 100 MG/10ML IV SOLN
INTRAVENOUS | Status: DC | PRN
  Administered 2022-09-14: 60 mg via INTRAVENOUS

## 2022-09-14 MED ORDER — SUGAMMADEX SODIUM 500 MG/5ML IV SOLN
INTRAVENOUS | Status: DC | PRN
  Administered 2022-09-14: 500 mg via INTRAVENOUS
  Administered 2022-09-14: 100 mg via INTRAVENOUS

## 2022-09-14 MED ORDER — ROCURONIUM BROMIDE 10 MG/ML (PF) SYRINGE
PREFILLED_SYRINGE | INTRAVENOUS | Status: AC
  Filled 2022-09-14: qty 10

## 2022-09-14 MED ORDER — ACETAMINOPHEN 10 MG/ML IV SOLN
INTRAVENOUS | Status: DC | PRN
  Administered 2022-09-14: 1000 mg via INTRAVENOUS

## 2022-09-14 MED ORDER — LACTATED RINGERS IV SOLN: INTRAVENOUS | Status: DC

## 2022-09-14 MED ORDER — ORAL CARE MOUTH RINSE: 15.0000 mL | Freq: Once | OROMUCOSAL | Status: AC

## 2022-09-14 MED ORDER — LIDOCAINE HCL (CARDIAC) PF 100 MG/5ML IV SOSY
PREFILLED_SYRINGE | INTRAVENOUS | Status: DC | PRN
  Administered 2022-09-14: 80 mg via INTRAVENOUS

## 2022-09-14 MED ORDER — FENTANYL CITRATE (PF) 100 MCG/2ML IJ SOLN
INTRAMUSCULAR | Status: DC | PRN
  Administered 2022-09-14: 50 ug via INTRAVENOUS

## 2022-09-14 MED ORDER — ONDANSETRON HCL 4 MG/2ML IJ SOLN: 4.0000 mg | Freq: Once | INTRAMUSCULAR | Status: DC | PRN

## 2022-09-14 MED ORDER — ACETAMINOPHEN 10 MG/ML IV SOLN
INTRAVENOUS | Status: AC
  Filled 2022-09-14: qty 100

## 2022-09-14 MED ORDER — TAMSULOSIN HCL 0.4 MG PO CAPS: 0.4000 mg | ORAL_CAPSULE | Freq: Every day | ORAL | 0 refills | Status: DC

## 2022-09-14 MED ORDER — OXYBUTYNIN CHLORIDE 5 MG PO TABS: ORAL_TABLET | ORAL | 0 refills | Status: DC

## 2022-09-14 MED ORDER — ONDANSETRON HCL 4 MG/2ML IJ SOLN
INTRAMUSCULAR | Status: DC | PRN
  Administered 2022-09-14: 4 mg via INTRAVENOUS

## 2022-09-14 MED ORDER — CHLORHEXIDINE GLUCONATE 0.12 % MT SOLN
OROMUCOSAL | Status: AC
  Administered 2022-09-14: 15 mL via OROMUCOSAL
  Filled 2022-09-14: qty 15

## 2022-09-14 MED ORDER — MIDAZOLAM HCL 2 MG/2ML IJ SOLN
INTRAMUSCULAR | Status: DC | PRN
  Administered 2022-09-14: 2 mg via INTRAVENOUS

## 2022-09-14 SURGICAL SUPPLY — 22 items
BAG DRAIN SIEMENS DORNER NS (MISCELLANEOUS) ×2 IMPLANT
BAG DRN NS LF (MISCELLANEOUS) ×2
BRUSH SCRUB EZ 1% IODOPHOR (MISCELLANEOUS) ×2 IMPLANT
CATH SET URETHRAL DILATOR (CATHETERS) IMPLANT
CATH URET FLEX-TIP 2 LUMEN 10F (CATHETERS) IMPLANT
DRAPE UTILITY 15X26 TOWEL STRL (DRAPES) ×2 IMPLANT
FIBER LASER MOSES 200 DFL (Laser) IMPLANT
GLOVE SURG UNDER POLY LF SZ7.5 (GLOVE) ×2 IMPLANT
GOWN STRL REUS W/ TWL LRG LVL3 (GOWN DISPOSABLE) ×2 IMPLANT
GOWN STRL REUS W/ TWL XL LVL3 (GOWN DISPOSABLE) ×2 IMPLANT
GOWN STRL REUS W/TWL LRG LVL3 (GOWN DISPOSABLE) ×2
GOWN STRL REUS W/TWL XL LVL3 (GOWN DISPOSABLE) ×2
GUIDEWIRE STR DUAL SENSOR (WIRE) ×2 IMPLANT
IV NS IRRIG 3000ML ARTHROMATIC (IV SOLUTION) ×2 IMPLANT
KIT TURNOVER CYSTO (KITS) ×2 IMPLANT
PACK CYSTO AR (MISCELLANEOUS) ×2 IMPLANT
SET CYSTO W/LG BORE CLAMP LF (SET/KITS/TRAYS/PACK) ×2 IMPLANT
SHEATH NAVIGATOR HD 12/14X46 (SHEATH) IMPLANT
STENT URET 6FRX26 CONTOUR (STENTS) IMPLANT
SURGILUBE 2OZ TUBE FLIPTOP (MISCELLANEOUS) ×2 IMPLANT
VALVE UROSEAL ADJ ENDO (VALVE) IMPLANT
WATER STERILE IRR 500ML POUR (IV SOLUTION) ×2 IMPLANT

## 2022-09-14 NOTE — Transfer of Care (Signed)
Immediate Anesthesia Transfer of Care Note  Patient: Martin Wayne [place] Sr.  Procedure(s) Performed: CYSTOSCOPY/URETEROSCOPY/HOLMIUM LASER/STENT PLACEMENT/URETHERAL DILATION (Right) CYSTOSCOPY WITH RETROGRADE PYELOGRAM  Patient Location: PACU  Anesthesia Type:General  Level of Consciousness: awake, drowsy and patient cooperative  Airway & Oxygen Therapy: Patient Spontanous Breathing and Patient connected to face mask oxygen  Post-op Assessment: Report given to RN and Post -op Vital signs reviewed and stable  Post vital signs: Reviewed and stable  Last Vitals:  Vitals Value Taken Time  BP    Temp    Pulse    Resp    SpO2      Last Pain:  Vitals:   09/14/22 1323  TempSrc:   PainSc: Asleep         Complications: No notable events documented.

## 2022-09-14 NOTE — Interval H&P Note (Signed)
History and Physical Interval Note:  CV:RRR Lungs:clear  09/14/2022 11:20 AM  Martin Wayne [place] Sr.  has presented today for surgery, with the diagnosis of Right Ureteropelvic Junction Stone.  The various methods of treatment have been discussed with the patient and family. After consideration of risks, benefits and other options for treatment, the patient has consented to  Procedure(s): CYSTOSCOPY/URETEROSCOPY/HOLMIUM LASER/STENT PLACEMENT (Right) as a surgical intervention.  The patient's history has been reviewed, patient examined, no change in status, stable for surgery.  I have reviewed the patient's chart and labs.  Questions were answered to the patient's satisfaction.     [place]

## 2022-09-14 NOTE — Anesthesia Preprocedure Evaluation (Signed)
Anesthesia Evaluation  Patient identified by MRN, date of birth, ID band Patient awake    Reviewed: Allergy & Precautions, NPO status , Patient's Chart, lab work & pertinent test results  Airway Mallampati: III  TM Distance: >3 FB Neck ROM: Full    Dental  (+) Teeth Intact   Pulmonary neg pulmonary ROS,    Pulmonary exam normal breath sounds clear to auscultation       Cardiovascular Exercise Tolerance: Good hypertension, Pt. on medications + Past MI  negative cardio ROS Normal cardiovascular exam Rhythm:Regular Rate:Normal     Neuro/Psych negative neurological ROS  negative psych ROS   GI/Hepatic negative GI ROS, Neg liver ROS,   Endo/Other  negative endocrine ROSMorbid obesity  Renal/GU   negative genitourinary   Musculoskeletal negative musculoskeletal ROS (+)   Abdominal (+) + obese,   Peds negative pediatric ROS (+)  Hematology negative hematology ROS (+)   Anesthesia Other Findings Past Medical History: No date: History of kidney stones No date: Hypertension No date: Kidney stones  Past Surgical History: 05/04/2021: COLONOSCOPY; N/A     Comment:  Procedure: COLONOSCOPY;  Surgeon: Lin Landsman,               MD;  Location: [facility] ENDOSCOPY;  Service:               Gastroenterology;  Laterality: N/A; No date: CYSTOSCOPY/URETEROSCOPY/HOLMIUM LASER/STENT PLACEMENT - Right No date: LITHOTRIPSY  BMI    Body Mass Index: 32.00 kg/m      Reproductive/Obstetrics negative OB ROS                             Anesthesia Physical Anesthesia Plan  ASA: 3  Anesthesia Plan: General   Post-op Pain Management:    Induction: Intravenous  PONV Risk Score and Plan: Ondansetron, Dexamethasone, Midazolam and Treatment may vary due to age or medical condition  Airway Management Planned: Oral ETT  Additional Equipment:   Intra-op Plan:   Post-operative Plan: Extubation in  OR  Informed Consent: I have reviewed the patients History and Physical, chart, labs and discussed the procedure including the risks, benefits and alternatives for the proposed anesthesia with the patient or authorized representative who has indicated his/her understanding and acceptance.     Dental Advisory Given  Plan Discussed with: CRNA and Surgeon  Anesthesia Plan Comments:         Anesthesia Quick Evaluation

## 2022-09-14 NOTE — Discharge Instructions (Addendum)
AMBULATORY SURGERY  DISCHARGE INSTRUCTIONS   The drugs that you were given will stay in your system until tomorrow so for the next 24 hours you should not:  Drive an automobile Make any legal decisions Drink any alcoholic beverage   You may resume regular meals tomorrow.  Today it is better to start with liquids and gradually work up to solid foods.  You may eat anything you prefer, but it is better to start with liquids, then soup and crackers, and gradually work up to solid foods.   Please notify your doctor immediately if you have any unusual bleeding, trouble breathing, redness and pain at the surgery site, drainage, fever, or pain not relieved by medication.    Additional Instructions:   DISCHARGE INSTRUCTIONS FOR KIDNEY STONE/URETERAL STENT   MEDICATIONS:  1. Resume all your other meds from home.  2.  AZO (over-the-counter) can help with the burning/stinging when you urinate. 3.  Oxybutynin and tamsulosin of her stent/bladder irritation, Rxs were sent to your pharmacy.  ACTIVITY:  1. May resume regular activities in 24 hours. 2. No driving while on narcotic pain medications  3. Drink plenty of water  4. Continue to walk at home - you can still get blood clots when you are at home, so keep active, but don't over do it.  5. May return to work/school tomorrow or when you feel ready   BATHING:  1. You can shower.  SIGNS/SYMPTOMS TO CALL:  Common postoperative symptoms include urinary frequency, urgency, bladder spasm and blood in the urine  Please call us if you have a fever greater than 101.5, uncontrolled nausea/vomiting, uncontrolled pain, dizziness, unable to urinate, excessively bloody urine, chest pain, shortness of breath, leg swelling, leg pain, or any other concerns or questions.   You can reach [place] at [phone].   FOLLOW-UP:  1. You have a follow-up appointment scheduled 09/23/2022        Please contact your physician with any problems or Same Day  Surgery at [phone], Monday through Friday 6 am to 4 pm, or [place] at [facility] number at [phone].

## 2022-09-14 NOTE — Anesthesia Procedure Notes (Signed)
Procedure Name: Intubation Date/Time: 09/14/2022 11:37 AM  Performed by: Jerrye Noble, CRNAPre-anesthesia Checklist: Patient identified, Emergency Drugs available, Suction available and Patient being monitored Patient Re-evaluated:Patient Re-evaluated prior to induction Oxygen Delivery Method: Circle system utilized Preoxygenation: Pre-oxygenation with 100% oxygen Induction Type: IV induction Ventilation: Mask ventilation without difficulty Laryngoscope Size: 4 and McGraph Grade View: Grade I Tube type: Oral Tube size: 7.0 mm Number of attempts: 1 Airway Equipment and Method: Stylet and Video-laryngoscopy Placement Confirmation: ETT inserted through vocal cords under direct vision, positive ETCO2 and breath sounds checked- equal and bilateral Secured at: 22 cm Tube secured with: Tape Dental Injury: Teeth and Oropharynx as per pre-operative assessment

## 2022-09-14 NOTE — Anesthesia Postprocedure Evaluation (Signed)
Anesthesia Post Note  Patient: Martin Wayne [place] Sr.  Procedure(s) Performed: CYSTOSCOPY/URETEROSCOPY/HOLMIUM LASER/STENT PLACEMENT/URETHERAL DILATION (Right) CYSTOSCOPY WITH RETROGRADE PYELOGRAM  Patient location during evaluation: PACU Anesthesia Type: General Level of consciousness: awake and oriented Pain management: satisfactory to patient Vital Signs Assessment: post-procedure vital signs reviewed and stable Respiratory status: spontaneous breathing and respiratory function stable Cardiovascular status: stable Anesthetic complications: no   No notable events documented.   Last Vitals:  Vitals:   09/14/22 1345 09/14/22 1401  BP: (!) 151/85 (!) 160/94  Pulse: (!) 57 (!) 54  Resp: 15 16  Temp: (!) 36.3 C (!) 36.1 C  SpO2: 95% 93%    Last Pain:  Vitals:   09/14/22 1401  TempSrc: Temporal  PainSc: 2                  VAN STAVEREN,Rozelle Caudle

## 2022-09-15 ENCOUNTER — Encounter: Payer: Self-pay | Admitting: Urology

## 2022-09-15 NOTE — Op Note (Signed)
Preoperative diagnosis: Right nephrolithiasis   Postoperative diagnosis: Right nephrolithiasis  Procedure:  Cystoscopy with urethral dilation Right ureteroscopy and stone removal Ureteroscopic laser lithotripsy Right ureteral stent placement (69F/26 cm)  Right retrograde pyelography with interpretation Intraoperative fluoroscopy < 30 minutes  Surgeon: Nicki Reaper C. Aahan Marques, M.D.  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  Prostate mild lateral lobe enlargement with small median lobe.  Bladder mucosa without erythema, solid or papillary lesions Ureteropyeloscopy-ureter normal in appearance without stricture, calculi or lesion.  13 mm right renal pelvic and 10 mm right lower calyceal calculi.  No mucosal lesions.  Scattered Randall's plaques Retrograde pyelography post procedure showed no filling defects, stone fragments or contrast extravasation  EBL: Minimal  Specimens: None   Indication: Martin Wayne [place] Sr. is a 66 y.o. male with bilateral nephrolithiasis.  Recent ED visit for right renal colic and migration of a 13 mm calculus to the renal pelvis.  Also noted to have a 10 mm lower calyceal calculus.  After reviewing the management options for treatment, the patient elected to proceed with the above surgical procedure(s). We have discussed the potential benefits and risks of the procedure, side effects of the proposed treatment, the likelihood of the patient achieving the goals of the procedure, and any potential problems that might occur during the procedure or recuperation. Informed consent has been obtained.  Description of procedure:  The patient was taken to the operating room and general anesthesia was induced.  The patient was placed in the dorsal lithotomy position, prepped and draped in the usual sterile fashion, and preoperative antibiotics  were administered. A preoperative time-out was performed.   A 21 French cystoscope was lubricated, passed per urethra and advanced proximally under direct vision.  The bulbar urethral stricture would not allow passage of the cystoscope and a 0.038 Sensor wire was placed through the cystoscope and advanced through the stricture into the bladder.  The cystoscope was removed and over-the-wire disposable urethral dilators were passed from 14-24 [place] without difficulty.  The cystoscope was then repassed alongside the guidewire with findings as described above.    Attention was directed to the right ureteral orifice and a 0.038 Sensor wire was then advanced up the ureter into the renal pelvis under fluoroscopic guidance.  The cystoscope was removed and a dual-lumen catheter was placed over the sensor wire and a second sensor wire was placed in a similar fashion.  Retrograde pyelogram was performed with filling defects noted in the renal pelvis and lower pole calyx consistent with known stones.  A second Sensor wire was placed and the dual-lumen catheter was removed.  A 12/14 French ureteral access sheath was placed over the working wire under fluoroscopic guidance without difficulty.  A dual channel digital flexible ureteroscope was placed through the access sheath and advanced into the renal pelvis without difficulty.  Pyeloscopy was performed with findings as described above  A 2 m Moses holmium laser fiber was placed through the ureteroscope and the stone was dusted at a setting of 0.3J/120 Hz.  As the stone decreased in size the power was decreased to 0.3J/80 Hz.  Due to stone hardness several larger fragments were chipped off the stone and migrated in a midpole calyx.  These were further treated with noncontact laser lithotripsy at 0.6J/60 Hz until there were no fragments identified larger than the typical laser fiber.  The scope was then advanced into  the lower pole calyx and the second calculus was  treated in a similar fashion.  Additional fragments were further treated with noncontact laser lithotripsy at 0.6J/60 Hz  Retrograde pyelogram was performed and each calyx was sequentially examined under fluoroscopic guidance and no significant size fragments were identified.  The ureteral access sheath and ureteroscope were removed in tandem and the ureter showed no evidence of injury or perforation.  A 33F/26 cm Contour ureteral stent was placed under fluoroscopic guidance.  The wire was then removed with an adequate stent curl noted in the renal pelvis as well as in the bladder.  The bladder was then emptied and the procedure ended.  The patient appeared to tolerate the procedure well and without complications.  After anesthetic reversal the patient was transported to the PACU in stable condition.   Plan: Follow-up office visit 09/23/2022 for cystoscopy with stent removal   John Giovanni, MD

## 2022-09-23 ENCOUNTER — Encounter: Payer: Self-pay | Admitting: Urology

## 2022-09-23 ENCOUNTER — Ambulatory Visit (INDEPENDENT_AMBULATORY_CARE_PROVIDER_SITE_OTHER): Payer: Medicare HMO | Admitting: Urology

## 2022-09-23 VITALS — BP 156/82 | HR 70 | Ht 75.0 in | Wt 251.0 lb

## 2022-09-23 DIAGNOSIS — Z466 Encounter for fitting and adjustment of urinary device: Secondary | ICD-10-CM | POA: Diagnosis not present

## 2022-09-23 NOTE — Progress Notes (Signed)
Indications: Patient is 66 y.o., who is s/p ureteroscopic removal of 10 and 13 mm right renal calculi 09/10/2022.  He had no postoperative problems or complaints.  The patient is presenting today for stent removal.  Procedure:  Flexible Cystoscopy with stent removal (59923)  Timeout was performed and the correct patient, procedure and participants were identified.    Description:  The patient was prepped and draped in the usual sterile fashion. Flexible cystosopy was performed.  The stent was visualized, grasped, and removed intact without difficulty. The patient tolerated the procedure well.  A single dose of oral antibiotics was given.  Complications:  None  Plan:  Instructed to call for fever or flank pain post stent removal 1 month follow-up with KUB and review of his 24-hour urine results    John Giovanni, MD

## 2022-10-27 ENCOUNTER — Ambulatory Visit: Payer: Medicare HMO | Admitting: Urology

## 2022-11-10 ENCOUNTER — Ambulatory Visit
Admission: RE | Admit: 2022-11-10 | Discharge: 2022-11-10 | Disposition: A | Discharge status: Home / Self Care | Payer: Medicare HMO | Source: Ambulatory Visit | Attending: Urology | Admitting: Urology

## 2022-11-10 ENCOUNTER — Ambulatory Visit (INDEPENDENT_AMBULATORY_CARE_PROVIDER_SITE_OTHER): Payer: Medicare HMO | Admitting: Urology

## 2022-11-10 ENCOUNTER — Encounter: Payer: Self-pay | Admitting: Urology

## 2022-11-10 VITALS — BP 133/79 | HR 63 | Ht 74.0 in | Wt 250.0 lb

## 2022-11-10 DIAGNOSIS — N200 Calculus of kidney: Secondary | ICD-10-CM | POA: Insufficient documentation

## 2022-11-10 NOTE — Progress Notes (Signed)
11/10/2022 8:09 AM   Martin Wayne [place] Sr. [id number] [id number]  Referring provider: Remi Haggard, [place],  [place] [address]  Chief Complaint  Patient presents with   Nephrolithiasis    Urologic history: 1.  Hematuria CTU 10/2021 bilateral, nonobstructing renal calculi Cystoscopy 12/2021 moderate lateral lobe enlargement with hypervascularity; small intravesical median lobe   2.  Nephrolithiasis 24-hour urine study remarkable for low urine volume at 1820 mL; hypercalciuria at 320 mg; low urine pH 5.4; elevated uric acid supersaturation 1.95; elevated sodium 306 mmol  Migration of a 13 mm calculus to the right renal pelvis October 2023 Right ureteroscopy with laser lithotripsy of 9 and 13 mm calculi 09/14/2022  HPI: 66 y.o. male presents for follow-up visit.  Doing well and denies flank, abdominal or pelvic pain.  No bothersome lower urinary tract symptoms KUB today personally reviewed and interpreted.  No right-sided calcifications identified.  Stable left renal calculi   PMH: Past Medical History:  Diagnosis Date   History of kidney stones    Hypertension    Kidney stones     Surgical History: Past Surgical History:  Procedure Laterality Date   COLONOSCOPY N/A 05/04/2021   Procedure: COLONOSCOPY;  Surgeon: Lin Landsman, MD;  Location: [facility] ENDOSCOPY;  Service: Gastroenterology;  Laterality: N/A;   CYSTOSCOPY W/ RETROGRADES  09/14/2022   Procedure: CYSTOSCOPY WITH RETROGRADE PYELOGRAM;  Surgeon: Abbie Sons, MD;  Location: [facility] ORS;  Service: Urology;;   CYSTOSCOPY/URETEROSCOPY/HOLMIUM LASER/STENT PLACEMENT Right 09/14/2022   Procedure: CYSTOSCOPY/URETEROSCOPY/HOLMIUM LASER/STENT PLACEMENT/URETHERAL DILATION;  Surgeon: Abbie Sons, MD;  Location: [facility] ORS;  Service: Urology;  Laterality: Right;   CYSTOSCOPY/URETEROSCOPY/HOLMIUM LASER/STENT PLACEMENT - Right     LITHOTRIPSY      Home Medications:  Allergies as of  11/10/2022   No Known Allergies      Medication List        Accurate as of November 10, 2022  8:09 AM. If you have any questions, ask your nurse or doctor.          STOP taking these medications    ondansetron 4 MG disintegrating tablet Commonly known as: ZOFRAN-ODT Stopped by: Abbie Sons, MD   oxybutynin 5 MG tablet Commonly known as: DITROPAN Stopped by: Abbie Sons, MD   oxyCODONE-acetaminophen 5-325 MG tablet Commonly known as: Percocet Stopped by: Abbie Sons, MD   tamsulosin 0.4 MG Caps capsule Commonly known as: FLOMAX Stopped by: Abbie Sons, MD       TAKE these medications    carvedilol 25 MG tablet Commonly known as: COREG Take 25 mg by mouth daily.   enalapril-hydrochlorothiazide 10-25 MG tablet Commonly known as: VASERETIC Take 1 tablet by mouth daily.   EQ Aspirin Adult Low Dose 81 MG tablet Generic drug: aspirin EC Take 81 mg by mouth daily.   ezetimibe 10 MG tablet Commonly known as: ZETIA Take 10 mg by mouth daily.   sildenafil 100 MG tablet Commonly known as: VIAGRA Take 100 mg by mouth as needed.   simvastatin 20 MG tablet Commonly known as: ZOCOR Take 20 mg by mouth daily.   VITAMIN D PO Take 1 tablet by mouth daily.        Allergies: No Known Allergies  Family History: No family history on file.  Social History:  reports that he has never smoked. He has never used smokeless tobacco. He reports that he does not drink alcohol and does not use drugs.   Physical Exam: BP  133/79   Pulse 63   Ht '6\' 2"'$  (1.88 m)   Wt 250 lb (113.4 kg)   BMI 32.10 kg/m   Constitutional:  Alert and oriented, No acute distress. HEENT: [place] AT Cardiovascular: No clubbing, cyanosis, or edema. Respiratory: Normal respiratory effort, no increased work of breathing. Psychiatric: Normal mood and affect.     Pertinent Imaging: KUB images were personally reviewed and interpreted   Assessment & Plan:    1.   Nephrolithiasis Doing well status post right ureteroscopic stone removal Follow-up 6 months with KUB Repeat 24 urine study prior to next follow-up    Abbie Sons, MD  [facility] [address] [place], [place] [address] [phone]

## 2022-11-10 NOTE — Addendum Note (Signed)
Addended by: Gaspar Cola L on: 11/10/2022 11:00 AM   Modules accepted: Orders

## 2023-01-10 ENCOUNTER — Ambulatory Visit: Payer: Medicare HMO | Admitting: Urology

## 2023-04-07 ENCOUNTER — Other Ambulatory Visit: Payer: Self-pay

## 2023-04-07 DIAGNOSIS — Z0184 Encounter for antibody response examination: Secondary | ICD-10-CM

## 2023-04-07 NOTE — Progress Notes (Signed)
Hep B titer drawn without difficulty and tolerated well.

## 2023-04-08 LAB — HEPATITIS B SURFACE ANTIBODY,QUALITATIVE: Hep B Surface Ab, Qual: NONREACTIVE

## 2023-04-25 IMAGING — CT CT ABD-PEL WO/W CM
3 of 12 series · 12 of 46 positions shown, 18 images · IV contrast (omnipaque)
Comparison: CT abdomen and pelvis 02/25/2016

CLINICAL DATA: Microhematuria

EXAM:
CT ABDOMEN AND PELVIS WITHOUT AND WITH CONTRAST
TECHNIQUE: Multidetector CT imaging of the abdomen and pelvis was performed
following the standard protocol before and following the bolus
administration of intravenous contrast.
CONTRAST:  100mL OMNIPAQUE IOHEXOL 350 MG/ML SOLN

[Series 2: abd without pre 5.00 · axial · non-contrast · 0.74mm/px · z∈[-1488,-1348]mm · 3 of 101 slices shown]
[im 15/101  soft-tissue]
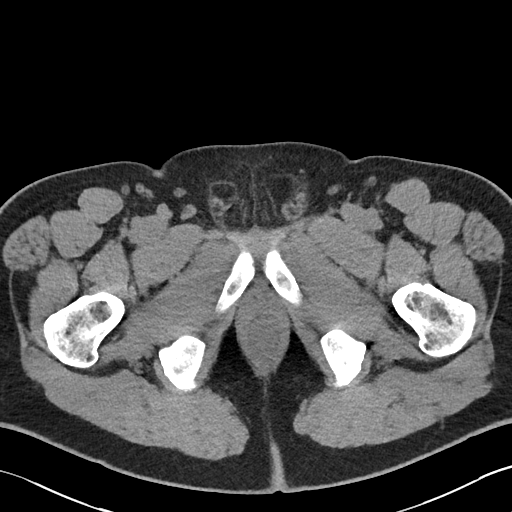
[im 29/101  soft-tissue]
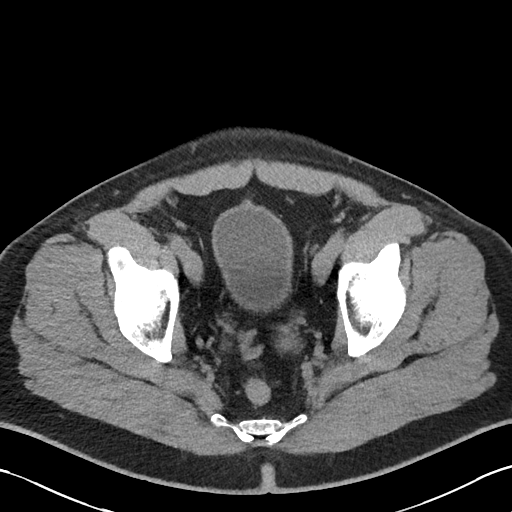
[im 43/101  soft-tissue]
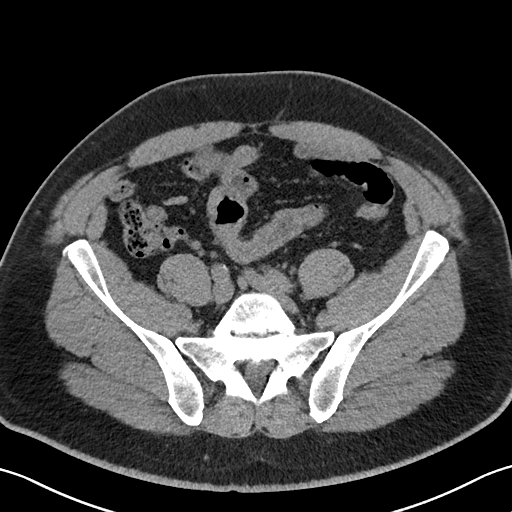

[Series 5: cor without without pre 2.00 cor · coronal · non-contrast · 0.74mm/px · 2 of 167 slices shown, 3 images]
[im 56/167  soft-tissue]
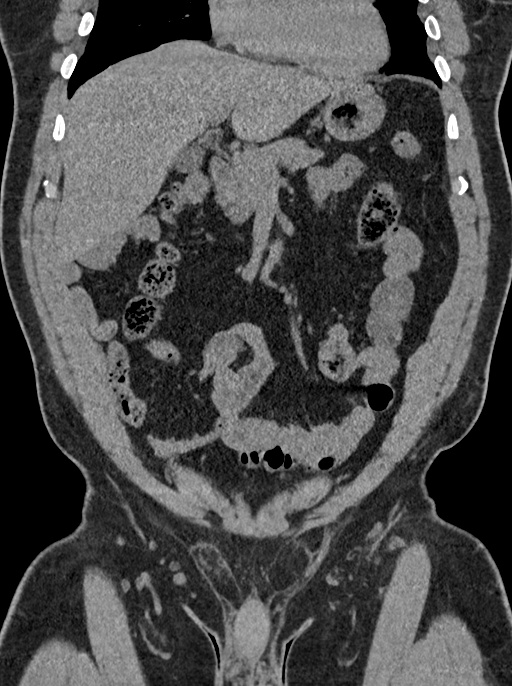
[im 56/167  bone]
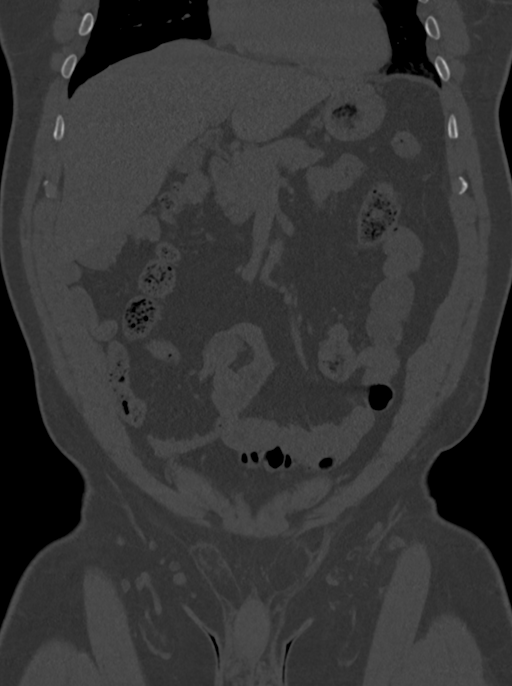
[im 111/167  soft-tissue]
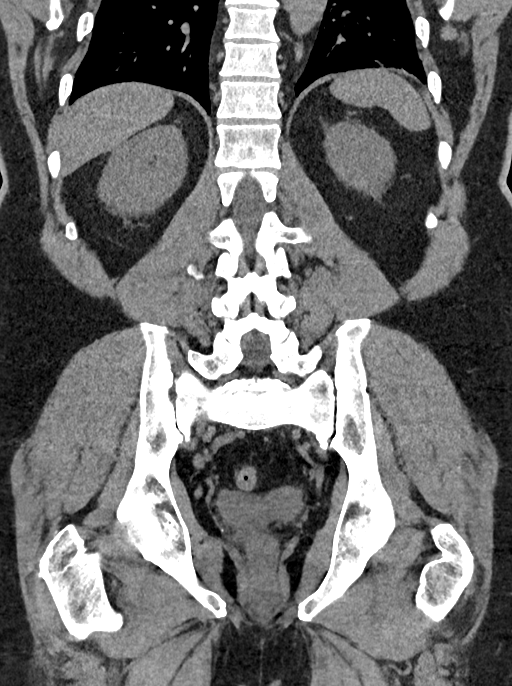

[Series 17: axial delay delay prone 5.00 · axial · delayed · 0.74mm/px · z∈[-1599,-1199]mm · 7 of 108 slices shown, 12 images]
[im 14/108  soft-tissue]
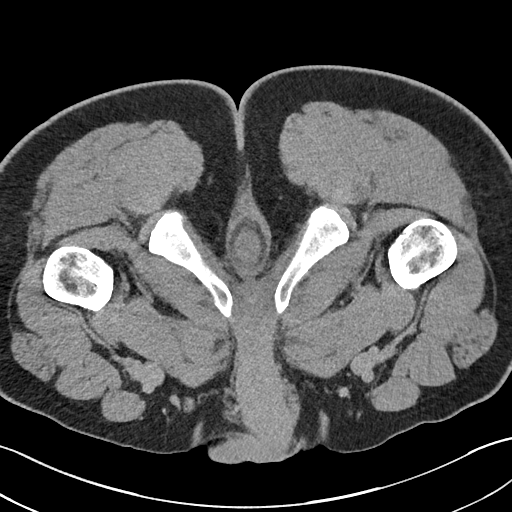
[im 14/108  bone]
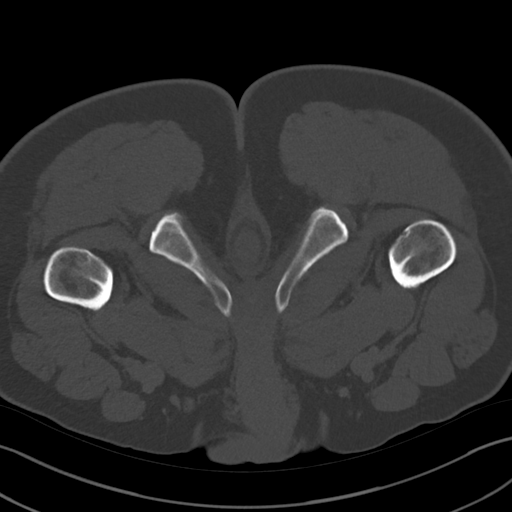
[im 27/108  soft-tissue]
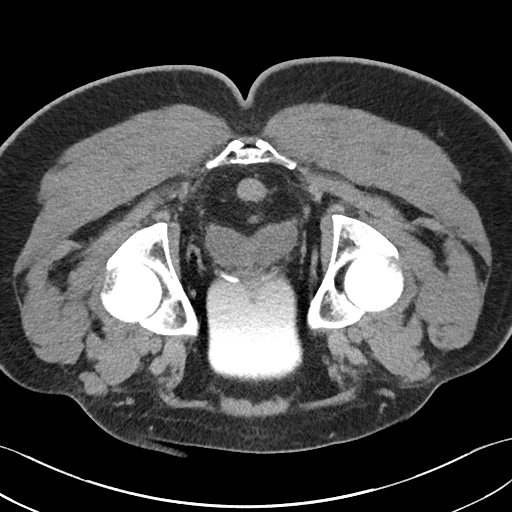
[im 41/108  soft-tissue]
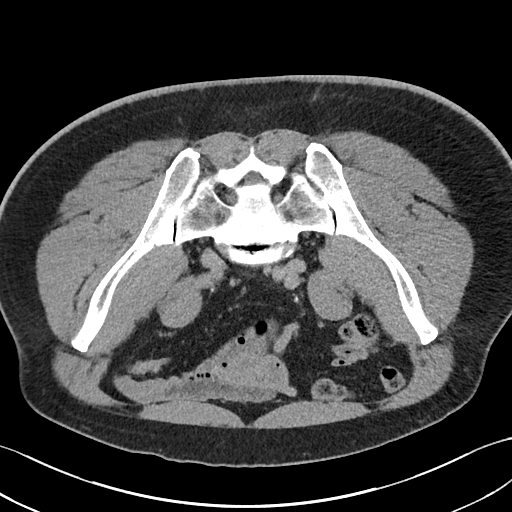
[im 54/108  soft-tissue]
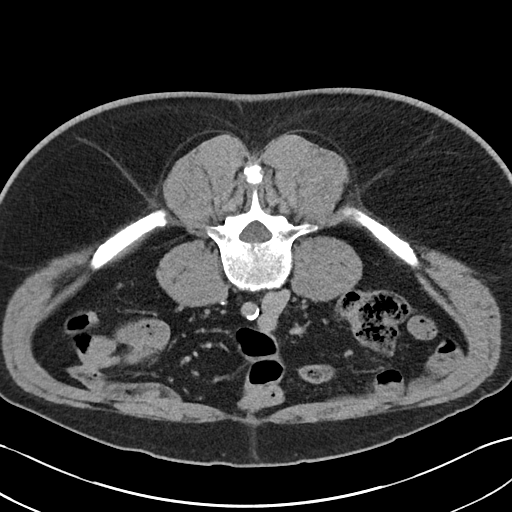
[im 54/108  lung]
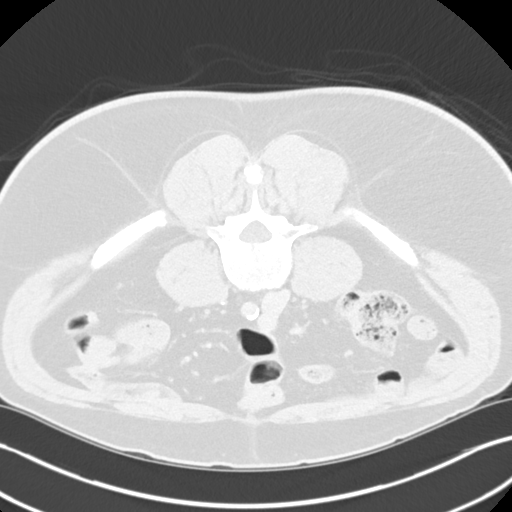
[im 67/108  soft-tissue]
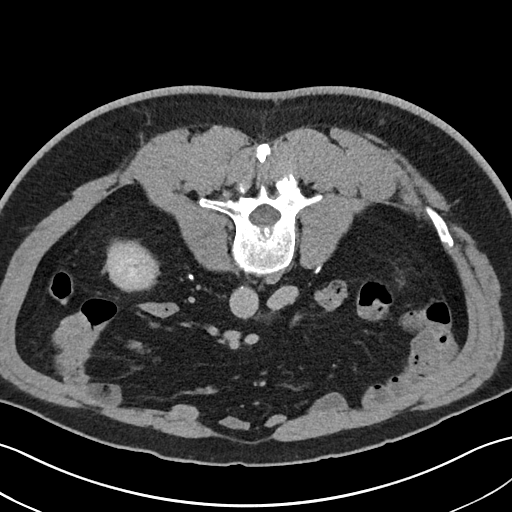
[im 67/108  lung]
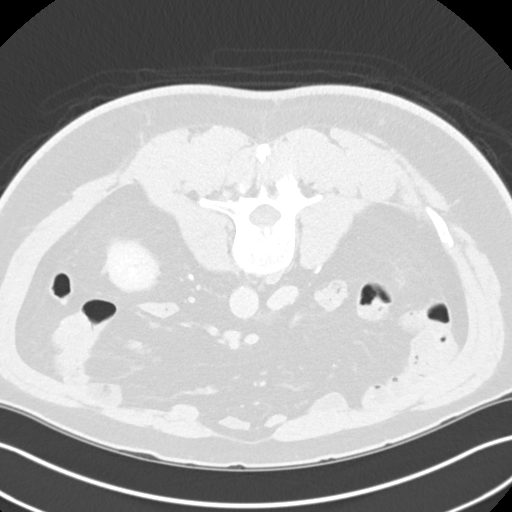
[im 81/108  soft-tissue]
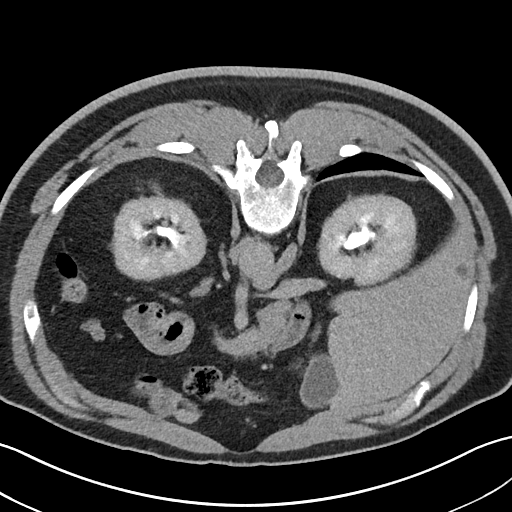
[im 81/108  lung]
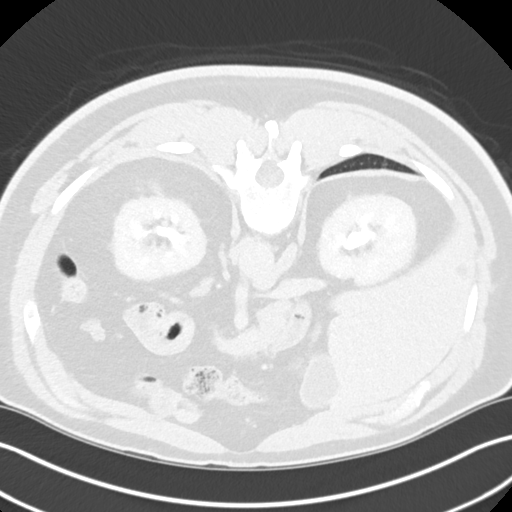
[im 94/108  soft-tissue]
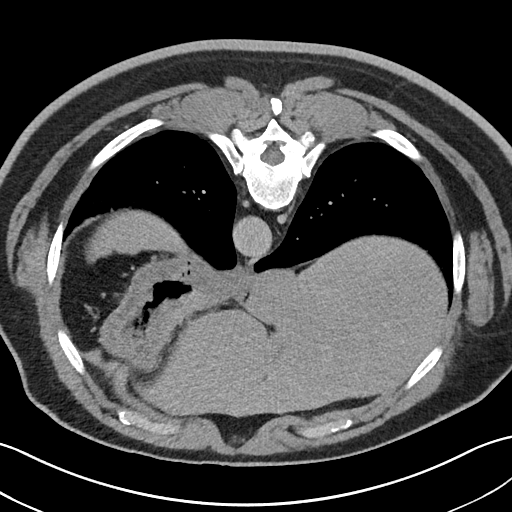
[im 94/108  lung]
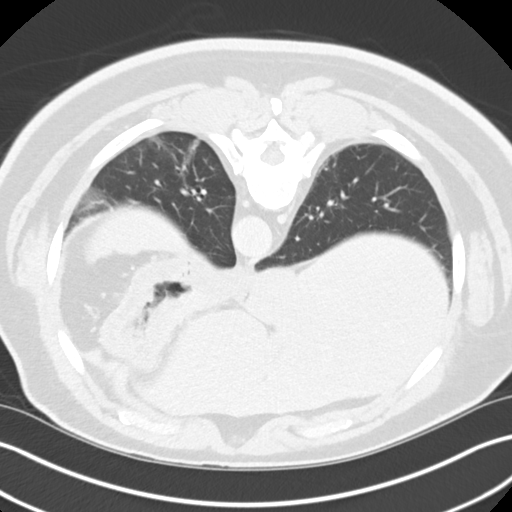

[12 of 46 positions shown; findings below may reference images not displayed]

FINDINGS: Lower chest: Mild dependent subsegmental atelectasis in the lower
lungs.

Hepatobiliary: Liver is normal in size and contour. There are
several small scattered hypodensities throughout the liver which
appear grossly similar to previous study and most likely represent
cysts or hemangiomas, measuring up to 11 mm in the left lobe.
Gallbladder appears normal. No biliary ductal dilatation identified.

Pancreas: Unremarkable. No pancreatic ductal dilatation or
surrounding inflammatory changes.

Spleen: Normal in size without focal abnormality.

Adrenals/Urinary Tract: Adrenal glands appear normal. A few renal
calculi identified in the right kidney measuring up to 9 mm in the
lower pole and 13 x 7 mm in the upper pole. Large early
staghorn-like calculus in the lower pole left kidney which measures
up to 15 x 9 mm in axial dimensions. No obstructing calculi
identified. No hydronephrosis. Ureters are normal caliber. No
enhancing renal mass identified bilaterally. Urinary bladder appears
within normal limits.

Stomach/Bowel: No bowel obstruction, free air or pneumatosis.
Colonic diverticulosis without evidence of acute diverticulitis.
Appendix is normal.

Vascular/Lymphatic: Aortic atherosclerosis. No enlarged abdominal or
pelvic lymph nodes.

Reproductive: Prostate gland is mildly enlarged with mild
indentation on the base of the urinary bladder.

Other: No ascites.  Small umbilical hernia containing fat.

Musculoskeletal: Degenerative changes of the lumbar spine most
significant at L5-S1.
IMPRESSION: 1. Bilateral nonobstructive nephrolithiasis as described.
2. Prostatomegaly.
3. Colonic diverticulosis.

## 2023-05-12 ENCOUNTER — Other Ambulatory Visit: Payer: Self-pay | Admitting: *Deleted

## 2023-05-12 DIAGNOSIS — N200 Calculus of kidney: Secondary | ICD-10-CM

## 2023-05-13 ENCOUNTER — Ambulatory Visit: Payer: Medicare HMO | Admitting: Urology

## 2023-06-04 LAB — LITHOLINK 24HR URINE PANEL
Ammonium, Urine: 37 mmol/24 hr (ref 15–60)
Calcium Oxalate Saturation: 6.12 (ref 6.00–10.00)
Calcium Phosphate Saturation: 0.29 — ABNORMAL LOW (ref 0.50–2.00)
Calcium, Urine: 219 mg/24 hr (ref <250)
Calcium/Creatinine Ratio: 106 mg/g creat (ref 34–196)
Calcium/Kg Body Weight: 2 mg/24 hr/kg (ref <4.0)
Chloride, Urine: 243 mmol/24 hr (ref 70–250)
Citrate, Urine: 564 mg/24 hr (ref >450)
Creatinine, Urine: 2057 mg/24 hr
Creatinine/Kg Body Weight: 18.5 mg/24 hr/kg (ref 11.9–24.4)
Magnesium, Urine: 117 mg/24 hr (ref 30–120)
Oxalate, Urine: 40 mg/24 hr (ref 20–40)
Phosphorus, Urine: 992 mg/24 hr (ref 600–1200)
Potassium, Urine: 87 mmol/24 hr (ref 20–100)
Protein Catabolic Rate: 1 g/kg/24 hr (ref 0.8–1.4)
Sodium, Urine: 219 mmol/24 hr — ABNORMAL HIGH (ref 50–150)
Sulfate, Urine: 49 meq/24 hr (ref 20–80)
Urea Nitrogen, Urine: 15.13 g/24 hr — ABNORMAL HIGH (ref 6.00–14.00)
Uric Acid Saturation: 2.25 — ABNORMAL HIGH (ref <1.00)
Uric Acid, Urine: 889 mg/24 hr — ABNORMAL HIGH (ref <800)
Urine Volume (Preserved): 1970 mL/24 hr (ref 500–4000)
pH, 24 hr, Urine: 5.428 — ABNORMAL LOW (ref 5.800–6.200)

## 2023-06-15 ENCOUNTER — Encounter: Payer: Self-pay | Admitting: Urology

## 2023-06-15 ENCOUNTER — Ambulatory Visit
Admission: RE | Admit: 2023-06-15 | Discharge: 2023-06-15 | Disposition: A | Discharge status: Home / Self Care | Payer: Medicare HMO | Source: Ambulatory Visit | Attending: Urology | Admitting: Urology

## 2023-06-15 ENCOUNTER — Ambulatory Visit: Payer: Medicare HMO | Admitting: Urology

## 2023-06-15 VITALS — BP 130/80 | HR 66 | Ht 74.0 in | Wt 257.6 lb

## 2023-06-15 DIAGNOSIS — Z466 Encounter for fitting and adjustment of urinary device: Secondary | ICD-10-CM | POA: Insufficient documentation

## 2023-06-15 DIAGNOSIS — N200 Calculus of kidney: Secondary | ICD-10-CM

## 2023-06-15 MED ORDER — POTASSIUM CITRATE ER 15 MEQ (1620 MG) PO TBCR: 1.0000 | EXTENDED_RELEASE_TABLET | Freq: Two times a day (BID) | ORAL | 11 refills | Status: DC

## 2023-06-15 NOTE — Progress Notes (Signed)
I, Duke Salvia, acting as a Neurosurgeon for Riki Altes, MD., have documented all relevant documentation on the behalf of Riki Altes, MD, as directed by  Riki Altes, MD while in the presence of Riki Altes, MD.   06/15/2023 10:55 AM   Martin Zavala [place]. 1956/02/17 [id number]  Referring provider: Armando Gang, FNP [address] [place],  [place] [address]  Chief Complaint  Patient presents with   Nephrolithiasis    Urologic history: 1.  Hematuria CTU 10/2021 bilateral, nonobstructing renal calculi Cystoscopy 12/2021 moderate lateral lobe enlargement with hypervascularity; small intravesical median lobe   2.  Nephrolithiasis 24-hour urine study remarkable for low urine volume at 1820 mL; hypercalciuria at 320 mg; low urine pH 5.4; elevated uric acid supersaturation 1.95; elevated sodium 306 mmol  Migration of a 13 mm calculus to the right renal pelvis October 2023 Right ureteroscopy with laser lithotripsy of 9 and 13 mm calculi 09/14/2022   HPI: 67 y.o. male presents for 6 month follow-up visit.  Doing well and denies flank, abdominal or pelvic pain.  No bothersome lower urinary tract symptoms. Repeat 24 hour urine study performed 05/31/23 was reviewed. Refer to the report for details. However, his hypercalciuria has resolved and the calcium level is 219 mg per 24 hour. Urine output was improved at 1,970 mL. Urinary citrate level looks good at 564 mg. Urine pH persistently low and urinary acid saturation was elevated; urine acid also slightly elevated at 889 mg (previous study was normal). Urine sodium also elevated but improved from his last study.   PMH: Past Medical History:  Diagnosis Date   History of kidney stones    Hypertension    Kidney stones     Surgical History: Past Surgical History:  Procedure Laterality Date   COLONOSCOPY N/A 05/04/2021   Procedure: COLONOSCOPY;  Surgeon: Toney Reil, MD;  Location: [facility] ENDOSCOPY;   Service: Gastroenterology;  Laterality: N/A;   CYSTOSCOPY W/ RETROGRADES  09/14/2022   Procedure: CYSTOSCOPY WITH RETROGRADE PYELOGRAM;  Surgeon: Riki Altes, MD;  Location: [facility] ORS;  Service: Urology;;   CYSTOSCOPY/URETEROSCOPY/HOLMIUM LASER/STENT PLACEMENT Right 09/14/2022   Procedure: CYSTOSCOPY/URETEROSCOPY/HOLMIUM LASER/STENT PLACEMENT/URETHERAL DILATION;  Surgeon: Riki Altes, MD;  Location: [facility] ORS;  Service: Urology;  Laterality: Right;   CYSTOSCOPY/URETEROSCOPY/HOLMIUM LASER/STENT PLACEMENT - Right     LITHOTRIPSY      Home Medications:  Allergies as of 06/15/2023   No Known Allergies      Medication List        Accurate as of June 15, 2023 10:55 AM. If you have any questions, ask your nurse or doctor.          STOP taking these medications    carvedilol 25 MG tablet Commonly known as: COREG Stopped by: Riki Altes   enalapril-hydrochlorothiazide 10-25 MG tablet Commonly known as: VASERETIC Stopped by: Riki Altes       TAKE these medications    enalapril 10 MG tablet Commonly known as: VASOTEC Take 10 mg by mouth daily.   EQ Aspirin Adult Low Dose 81 MG tablet Generic drug: aspirin EC Take 81 mg by mouth daily.   ezetimibe 10 MG tablet Commonly known as: ZETIA Take 10 mg by mouth daily.   hydrochlorothiazide 25 MG tablet Commonly known as: HYDRODIURIL Take 25 mg by mouth daily.   Potassium Citrate 15 MEQ (1620 MG) Tbcr Take 1 tablet by mouth 2 (two) times daily. Started by: Riki Altes  sildenafil 100 MG tablet Commonly known as: VIAGRA Take 100 mg by mouth as needed.   simvastatin 20 MG tablet Commonly known as: ZOCOR Take 20 mg by mouth daily.   VITAMIN D PO Take 1 tablet by mouth daily.        Social History:  reports that he has never smoked. He has never used smokeless tobacco. He reports that he does not drink alcohol and does not use drugs.   Physical Exam: BP 130/80 (BP Location: Left Arm, Patient  Position: Sitting, Cuff Size: Large)   Pulse 66   Ht 6\' 2"  (1.88 m)   Wt 257 lb 9.6 oz (116.8 kg)   BMI 33.07 kg/m   Constitutional:  Alert and oriented, No acute distress. HEENT: [place] AT Cardiovascular: No clubbing, cyanosis, or edema. Respiratory: Normal respiratory effort, no increased work of breathing. Psychiatric: Normal mood and affect.    Assessment & Plan:    1.  Recurrent Nephrolithiasis I recommend increasing water intake to keep urine output closer to 2.5 L per day. Urine pH is low and will start potassium citrate 15-MeQ twice daily. On chart review, I do not see any stone analysis, though the patient states a few years ago, a stone was sent for analysis, which was calcium based. We discussed watching sodium in his diet, especially in processed foods. KUB today. Follow up in 6 months with KUB.      [facility] [address], [address] [place], [place] [address] [phone]

## 2023-06-20 ENCOUNTER — Telehealth: Payer: Self-pay

## 2023-06-20 NOTE — Telephone Encounter (Signed)
Patient called asking if he could get a handicap form for placard to use as needed for when he has flare up of recurrent kidney stones? Patient states when he has the pain it is so intense he has hard time walking to places. Advised patient I would check with the provider.

## 2023-06-21 NOTE — Telephone Encounter (Signed)
The handicap placard's are for conditions that are present 100% of the time and cannot be provided for symptom flares

## 2023-06-22 NOTE — Telephone Encounter (Signed)
Patient advised.

## 2023-08-10 ENCOUNTER — Other Ambulatory Visit: Payer: Self-pay

## 2023-08-10 ENCOUNTER — Emergency Department
Admission: EM | Admit: 2023-08-10 | Discharge: 2023-08-10 | Disposition: A | Discharge status: Home / Self Care | Payer: Medicare HMO | Attending: Emergency Medicine | Admitting: Emergency Medicine

## 2023-08-10 ENCOUNTER — Emergency Department: Payer: Medicare HMO

## 2023-08-10 ENCOUNTER — Encounter: Payer: Self-pay | Admitting: Emergency Medicine

## 2023-08-10 DIAGNOSIS — Z79899 Other long term (current) drug therapy: Secondary | ICD-10-CM | POA: Diagnosis not present

## 2023-08-10 DIAGNOSIS — N390 Urinary tract infection, site not specified: Secondary | ICD-10-CM | POA: Diagnosis not present

## 2023-08-10 DIAGNOSIS — R109 Unspecified abdominal pain: Secondary | ICD-10-CM | POA: Insufficient documentation

## 2023-08-10 DIAGNOSIS — B9689 Other specified bacterial agents as the cause of diseases classified elsewhere: Secondary | ICD-10-CM | POA: Insufficient documentation

## 2023-08-10 DIAGNOSIS — I10 Essential (primary) hypertension: Secondary | ICD-10-CM | POA: Diagnosis not present

## 2023-08-10 DIAGNOSIS — R39198 Other difficulties with micturition: Secondary | ICD-10-CM | POA: Diagnosis present

## 2023-08-10 DIAGNOSIS — Z7982 Long term (current) use of aspirin: Secondary | ICD-10-CM | POA: Insufficient documentation

## 2023-08-10 DIAGNOSIS — M545 Low back pain, unspecified: Secondary | ICD-10-CM | POA: Insufficient documentation

## 2023-08-10 LAB — URINALYSIS, ROUTINE W REFLEX MICROSCOPIC
Bacteria, UA: NONE SEEN
Bilirubin Urine: NEGATIVE
Glucose, UA: NEGATIVE mg/dL
Ketones, ur: NEGATIVE mg/dL
Nitrite: NEGATIVE
Protein, ur: 100 mg/dL — AB
RBC / HPF: >50 RBC/hpf (ref 0–5)
Specific Gravity, Urine: 1.021 (ref 1.005–1.030)
Squamous Epithelial / HPF: 0 /HPF (ref 0–5)
WBC, UA: >50 WBC/hpf (ref 0–5)
pH: 6 (ref 5.0–8.0)

## 2023-08-10 LAB — COMPREHENSIVE METABOLIC PANEL WITH GFR
ALT: 16 U/L (ref 0–44)
AST: 17 U/L (ref 15–41)
Albumin: 4.2 g/dL (ref 3.5–5.0)
Alkaline Phosphatase: 48 U/L (ref 38–126)
Anion gap: 12 (ref 5–15)
BUN: 16 mg/dL (ref 8–23)
CO2: 25 mmol/L (ref 22–32)
Calcium: 10.6 mg/dL — ABNORMAL HIGH (ref 8.9–10.3)
Chloride: 100 mmol/L (ref 98–111)
Creatinine, Ser: 1.02 mg/dL (ref 0.61–1.24)
GFR, Estimated: >60 mL/min (ref >60)
Glucose, Bld: 134 mg/dL — ABNORMAL HIGH (ref 70–99)
Potassium: 3.8 mmol/L (ref 3.5–5.1)
Sodium: 137 mmol/L (ref 135–145)
Total Bilirubin: 1.2 mg/dL (ref 0.3–1.2)
Total Protein: 7.5 g/dL (ref 6.5–8.1)

## 2023-08-10 LAB — CBC WITH DIFFERENTIAL/PLATELET
Abs Immature Granulocytes: 0.03 10*3/uL (ref 0.00–0.07)
Basophils Absolute: 0 10*3/uL (ref 0.0–0.1)
Basophils Relative: 0 %
Eosinophils Absolute: 0 10*3/uL (ref 0.0–0.5)
Eosinophils Relative: 0 %
HCT: 43.6 % (ref 39.0–52.0)
Hemoglobin: 14.8 g/dL (ref 13.0–17.0)
Immature Granulocytes: 0 %
Lymphocytes Relative: 9 %
Lymphs Abs: 1.1 10*3/uL (ref 0.7–4.0)
MCH: 29.7 pg (ref 26.0–34.0)
MCHC: 33.9 g/dL (ref 30.0–36.0)
MCV: 87.4 fL (ref 80.0–100.0)
Monocytes Absolute: 0.9 10*3/uL (ref 0.1–1.0)
Monocytes Relative: 8 %
Neutro Abs: 9.9 10*3/uL — ABNORMAL HIGH (ref 1.7–7.7)
Neutrophils Relative %: 83 %
Platelets: 177 10*3/uL (ref 150–400)
RBC: 4.99 MIL/uL (ref 4.22–5.81)
RDW: 12.2 % (ref 11.5–15.5)
WBC: 12.1 10*3/uL — ABNORMAL HIGH (ref 4.0–10.5)
nRBC: 0 % (ref 0.0–0.2)

## 2023-08-10 LAB — CHLAMYDIA/NGC RT PCR (ARMC ONLY)
Chlamydia Tr: NOT DETECTED
N gonorrhoeae: NOT DETECTED

## 2023-08-10 MED ORDER — ONDANSETRON HCL 4 MG/2ML IJ SOLN
4.0000 mg | Freq: Once | INTRAMUSCULAR | Status: AC
  Administered 2023-08-10: 4 mg via INTRAVENOUS
  Filled 2023-08-10: qty 2

## 2023-08-10 MED ORDER — SODIUM CHLORIDE 0.9 % IV BOLUS (SEPSIS)
500.0000 mL | Freq: Once | INTRAVENOUS | Status: AC
  Administered 2023-08-10: 500 mL via INTRAVENOUS

## 2023-08-10 MED ORDER — CEPHALEXIN 500 MG PO CAPS: 500.0000 mg | ORAL_CAPSULE | Freq: Two times a day (BID) | ORAL | 0 refills | Status: DC

## 2023-08-10 MED ORDER — KETOROLAC TROMETHAMINE 30 MG/ML IJ SOLN
15.0000 mg | Freq: Once | INTRAMUSCULAR | Status: AC
  Administered 2023-08-10: 15 mg via INTRAVENOUS
  Filled 2023-08-10: qty 1

## 2023-08-10 MED ORDER — SODIUM CHLORIDE 0.9 % IV SOLN
2.0000 g | Freq: Once | INTRAVENOUS | Status: AC
  Administered 2023-08-10: 2 g via INTRAVENOUS
  Filled 2023-08-10: qty 20

## 2023-08-10 NOTE — Discharge Instructions (Signed)
You may take over the counter Tylenol 1000 mg every 6 hours as needed for pain.  You may also take over-the-counter Azo as needed for discomfort with urination.  Please take your antibiotics until complete.

## 2023-08-10 NOTE — ED Triage Notes (Signed)
Patient ambulatory to triage with steady gait, without difficulty or distress noted; pt reports pain to sides since yesterday with diff urinating; st hx kidney stones

## 2023-08-10 NOTE — ED Provider Notes (Signed)
[facility] Provider Note    Event Date/Time   First MD Initiated Contact with Patient 08/10/23 [phone]     (approximate)   History   Back Pain   HPI  Martin Micke [place] Sr[place] is a 67 y.o. male with history of hypertension, kidney stones who presents to the emergency department with left lower back pain and difficulty with urinating.  States he feels like he has to urinate frequently and does not get much out.  No dysuria or discharge.  No fever.  No nausea, vomiting or diarrhea.  He was concerned he could have a kidney stone.   History provided by patient.    Past Medical History:  Diagnosis Date   History of kidney stones    Hypertension    Kidney stones     Past Surgical History:  Procedure Laterality Date   COLONOSCOPY N/A 05/04/2021   Procedure: COLONOSCOPY;  Surgeon: Toney Reil, MD;  Location: [facility] ENDOSCOPY;  Service: Gastroenterology;  Laterality: N/A;   CYSTOSCOPY W/ RETROGRADES  09/14/2022   Procedure: CYSTOSCOPY WITH RETROGRADE PYELOGRAM;  Surgeon: Riki Altes, MD;  Location: [facility] ORS;  Service: Urology;;   CYSTOSCOPY/URETEROSCOPY/HOLMIUM LASER/STENT PLACEMENT Right 09/14/2022   Procedure: CYSTOSCOPY/URETEROSCOPY/HOLMIUM LASER/STENT PLACEMENT/URETHERAL DILATION;  Surgeon: Riki Altes, MD;  Location: [facility] ORS;  Service: Urology;  Laterality: Right;   CYSTOSCOPY/URETEROSCOPY/HOLMIUM LASER/STENT PLACEMENT - Right     LITHOTRIPSY      MEDICATIONS:  Prior to Admission medications   Medication Sig Start Date End Date Taking? Authorizing Provider  enalapril (VASOTEC) 10 MG tablet Take 10 mg by mouth daily. 06/12/23   [provider]  EQ ASPIRIN ADULT LOW DOSE 81 MG EC tablet Take 81 mg by mouth daily. 01/13/21   [provider]  ezetimibe (ZETIA) 10 MG tablet Take 10 mg by mouth daily. 10/02/21   [provider]  hydrochlorothiazide (HYDRODIURIL) 25 MG tablet Take 25 mg by mouth daily. 06/12/23    [provider]  Potassium Citrate 15 MEQ (1620 MG) TBCR Take 1 tablet by mouth 2 (two) times daily. 06/15/23   Stoioff, Verna Czech, MD  sildenafil (VIAGRA) 100 MG tablet Take 100 mg by mouth as needed. 04/08/21   [provider]  simvastatin (ZOCOR) 20 MG tablet Take 20 mg by mouth daily. 02/10/21   [provider]  VITAMIN D PO Take 1 tablet by mouth daily.    [provider]    Physical Exam   Triage Vital Signs: ED Triage Vitals  Encounter Vitals Group     BP 08/10/23 0320 (!) 156/83     Systolic BP Percentile --      Diastolic BP Percentile --      Pulse Rate 08/10/23 0320 86     Resp 08/10/23 0320 18     Temp 08/10/23 0320 98.3 F (36.8 C)     Temp Source 08/10/23 0320 Oral     SpO2 08/10/23 0320 98 %     Weight 08/10/23 0316 240 lb (108.9 kg)     Height 08/10/23 0316 6\' 2"  (1.88 m)     Head Circumference --      Peak Flow --      Pain Score 08/10/23 0316 9     Pain Loc --      Pain Education --      Exclude from Growth Chart --     Most recent vital signs: Vitals:   08/10/23 0320  BP: (!) 156/83  Pulse:  86  Resp: 18  Temp: 98.3 F (36.8 C)  SpO2: 98%    CONSTITUTIONAL: Alert, responds appropriately to questions. Well-appearing; well-nourished HEAD: Normocephalic, atraumatic EYES: Conjunctivae clear, pupils appear equal, sclera nonicteric ENT: normal nose; moist mucous membranes NECK: Supple, normal ROM CARD: RRR; S1 and S2 appreciated RESP: Normal chest excursion without splinting or tachypnea; breath sounds clear and equal bilaterally; no wheezes, no rhonchi, no rales, no hypoxia or respiratory distress, speaking full sentences ABD/GI: Non-distended; soft, non-tender, no rebound, no guarding, no peritoneal signs BACK: The back appears normal EXT: Normal ROM in all joints; no deformity noted, no edema SKIN: Normal color for age and race; warm; no rash on exposed skin NEURO: Moves all extremities equally, normal speech PSYCH:  The patient's mood and manner are appropriate.   ED Results / Procedures / Treatments   LABS: (all labs ordered are listed, but only abnormal results are displayed) Labs Reviewed  URINALYSIS, ROUTINE W REFLEX MICROSCOPIC - Abnormal; Notable for the following components:      Result Value   Color, Urine YELLOW (*)    APPearance CLOUDY (*)    Hgb urine dipstick LARGE (*)    Protein, ur 100 (*)    Leukocytes,Ua LARGE (*)    All other components within normal limits  CBC WITH DIFFERENTIAL/PLATELET - Abnormal; Notable for the following components:   WBC 12.1 (*)    Neutro Abs 9.9 (*)    All other components within normal limits  COMPREHENSIVE METABOLIC PANEL - Abnormal; Notable for the following components:   Glucose, Bld 134 (*)    Calcium 10.6 (*)    All other components within normal limits  URINE CULTURE  CHLAMYDIA/NGC RT PCR ([facility] ONLY)               EKG:   RADIOLOGY: My personal review and interpretation of imaging: No kidney stone or pyelonephritis.  I have personally reviewed all radiology reports.   CT Renal Stone Study  Result Date: 08/10/2023 CLINICAL DATA:  Flank pain and difficulty urinating. EXAM: CT ABDOMEN AND PELVIS WITHOUT CONTRAST TECHNIQUE: Multidetector CT imaging of the abdomen and pelvis was performed following the standard protocol without IV contrast. RADIATION DOSE REDUCTION: This exam was performed according to the departmental dose-optimization program which includes automated exposure control, adjustment of the mA and/or kV according to patient size and/or use of iterative reconstruction technique. COMPARISON:  CT without contrast 09/07/2022, CT without contrast 11/18/2021. FINDINGS: Lower chest: No acute abnormality. Hepatobiliary: Small scattered hepatic cysts. Liver measuring 19 cm length with moderate steatosis. No focal abnormality is seen without contrast. The gallbladder and bile ducts are unremarkable. Pancreas: Unremarkable without contrast.  Spleen: Unremarkable without contrast. Adrenals/Urinary Tract: There is no adrenal mass. On 09/07/2022 there was a 13 mm right renal pelvis stone, which is no longer seen including in the right ureter. There are occasional punctate nonobstructive caliceal stones in the right kidney. On the left, there are 2 or 3 punctate caliceal stones in the upper pole and a larger segmented stone in inferior pole with the more superior component of this measuring 1.7 cm, inferior to this the smaller component is 8 mm and has a somewhat squared appearance. No contour deforming mass of either kidney is seen. Perinephric stranding is again noted but unchanged. No evidence of hydronephrosis or ureteral stone either side. The bladder is underdistended but does appear somewhat thickened. There are perivesical stranding changes concerning for cystitis. Correlate with urinalysis for possible significance. Stomach/Bowel: No dilatation or  wall thickening, including of the appendix. Scattered colonic diverticulosis. No evidence of diverticulitis or colitis. Vascular/Lymphatic: Aortic atherosclerosis. No enlarged abdominal or pelvic lymph nodes. Reproductive: Enlarged prostate with dystrophic calcification in the right lobe, transverse axis 5.4 cm, previously 4.8 cm. Other: There are small umbilical and bilateral inguinal fat hernias. There is no incarcerated hernia. There is no free air, free fluid or free hemorrhage. Musculoskeletal: Degenerative change thoracic and lumbar spine, in the lumbar spine with advanced degenerative disc disease with spondylosis at L5-S1. No acute or other significant osseous findings. IMPRESSION: 1. No evidence of hydronephrosis or ureteral stone. 2. Bilateral nonobstructive caliceal stones. 3. Perivesical stranding changes concerning for cystitis. Correlate with urinalysis for possible significance. 4. Prostatomegaly, interval increased since 09/07/2022. 5. Diverticulosis without evidence of diverticulitis. 6.  Moderate hepatic steatosis with small cysts. 7. Umbilical and inguinal fat hernias. 8. Aortic atherosclerosis. Aortic Atherosclerosis (ICD10-I70.0). Electronically Signed   By: Almira Bar M.D.   On: 08/10/2023 04:27     PROCEDURES:  Critical Care performed: No   CRITICAL CARE Performed by: Baxter Hire Kegan Mckeithan   Total critical care time: 0 minutes  Critical care time was exclusive of separately billable procedures and treating other patients.  Critical care was necessary to treat or prevent imminent or life-threatening deterioration.  Critical care was time spent personally by me on the following activities: development of treatment plan with patient and/or surrogate as well as nursing, discussions with consultants, evaluation of patient's response to treatment, examination of patient, obtaining history from patient or surrogate, ordering and performing treatments and interventions, ordering and review of laboratory studies, ordering and review of radiographic studies, pulse oximetry and re-evaluation of patient's condition.   Procedures    IMPRESSION / MDM / ASSESSMENT AND PLAN / ED COURSE  I reviewed the triage vital signs and the nursing notes.    Patient here with flank pain, urinary symptoms.     DIFFERENTIAL DIAGNOSIS (includes but not limited to):   UTI, pyelonephritis, kidney stone, doubt appendicitis, colitis, bowel obstruction, diverticulitis   Patient's presentation is most consistent with acute presentation with potential threat to life or bodily function.   PLAN: Labs obtained from triage show leukocytosis with left shift.  Normal creatinine, LFTs.  Urine is grossly infected.  Will send urine culture.  Will also test for gonorrhea and chlamydia.  CT scan reviewed and interpreted by myself and the radiologist and shows no ureterolithiasis, hydronephrosis or pyelonephritis.  It does show signs of cystitis.  Will give IV antibiotics, pain and nausea  medicine.   MEDICATIONS GIVEN IN ED: Medications  cefTRIAXone (ROCEPHIN) 2 g in sodium chloride 0.9 % 100 mL IVPB (2 g Intravenous New Bag/Given 08/10/23 0520)  sodium chloride 0.9 % bolus 500 mL (500 mLs Intravenous New Bag/Given 08/10/23 0521)  ketorolac (TORADOL) 30 MG/ML injection 15 mg (15 mg Intravenous Given 08/10/23 0518)  ondansetron (ZOFRAN) injection 4 mg (4 mg Intravenous Given 08/10/23 0517)     ED COURSE: Patient feeling better.  Able to urinate without difficulty.  No urinary retention on CT imaging.  I feel he is safe for discharge home on antibiotics.  Previous urine cultures have grown group B strep.  Will discharge on Keflex to cover for group B strep but also more common bacteria such as E. coli.   At this time, I do not feel there is any life-threatening condition present. I reviewed all nursing notes, vitals, pertinent previous records.  All lab and urine results, EKGs, imaging ordered have been  independently reviewed and interpreted by myself.  I reviewed all available radiology reports from any imaging ordered this visit.  Based on my assessment, I feel the patient is safe to be discharged home without further emergent workup and can continue workup as an outpatient as needed. Discussed all findings, treatment plan as well as usual and customary return precautions.  They verbalize understanding and are comfortable with this plan.  Outpatient follow-up has been provided as needed.  All questions have been answered.    CONSULTS:  none   OUTSIDE RECORDS REVIEWED: Reviewed last urology note on 06/15/2023.       FINAL CLINICAL IMPRESSION(S) / ED DIAGNOSES   Final diagnoses:  Acute UTI     Rx / DC Orders   ED Discharge Orders          Ordered    cephALEXin (KEFLEX) 500 MG capsule  2 times daily        08/10/23 0534             Note:  This document was prepared using Dragon voice recognition software and may include unintentional dictation errors.    Tyla Burgner, Layla Maw, DO 08/10/23 [phone]

## 2023-08-12 LAB — URINE CULTURE: Culture: >=100000 — AB

## 2023-09-12 ENCOUNTER — Other Ambulatory Visit: Payer: Self-pay

## 2023-09-12 ENCOUNTER — Emergency Department: Payer: Medicare HMO

## 2023-09-12 DIAGNOSIS — N200 Calculus of kidney: Secondary | ICD-10-CM | POA: Insufficient documentation

## 2023-09-12 DIAGNOSIS — R350 Frequency of micturition: Secondary | ICD-10-CM | POA: Diagnosis present

## 2023-09-12 DIAGNOSIS — N390 Urinary tract infection, site not specified: Secondary | ICD-10-CM | POA: Insufficient documentation

## 2023-09-12 LAB — CBC
HCT: 42.1 % (ref 39.0–52.0)
Hemoglobin: 13.6 g/dL (ref 13.0–17.0)
MCH: 29.1 pg (ref 26.0–34.0)
MCHC: 32.3 g/dL (ref 30.0–36.0)
MCV: 90.1 fL (ref 80.0–100.0)
Platelets: 159 10*3/uL (ref 150–400)
RBC: 4.67 MIL/uL (ref 4.22–5.81)
RDW: 12.6 % (ref 11.5–15.5)
WBC: 8.2 10*3/uL (ref 4.0–10.5)
nRBC: 0 % (ref 0.0–0.2)

## 2023-09-12 LAB — COMPREHENSIVE METABOLIC PANEL
ALT: 14 U/L (ref 0–44)
AST: 17 U/L (ref 15–41)
Albumin: 4.2 g/dL (ref 3.5–5.0)
Alkaline Phosphatase: 50 U/L (ref 38–126)
Anion gap: 9 (ref 5–15)
BUN: 22 mg/dL (ref 8–23)
CO2: 26 mmol/L (ref 22–32)
Calcium: 10.8 mg/dL — ABNORMAL HIGH (ref 8.9–10.3)
Chloride: 101 mmol/L (ref 98–111)
Creatinine, Ser: 1.23 mg/dL (ref 0.61–1.24)
GFR, Estimated: >60 mL/min (ref >60)
Glucose, Bld: 116 mg/dL — ABNORMAL HIGH (ref 70–99)
Potassium: 4 mmol/L (ref 3.5–5.1)
Sodium: 136 mmol/L (ref 135–145)
Total Bilirubin: 0.8 mg/dL (ref 0.3–1.2)
Total Protein: 7.9 g/dL (ref 6.5–8.1)

## 2023-09-12 MED ORDER — ACETAMINOPHEN 325 MG PO TABS
650.0000 mg | ORAL_TABLET | Freq: Once | ORAL | Status: AC
  Administered 2023-09-12: 650 mg via ORAL
  Filled 2023-09-12: qty 2

## 2023-09-12 MED ORDER — OXYCODONE-ACETAMINOPHEN 5-325 MG PO TABS
1.0000 | ORAL_TABLET | ORAL | Status: DC | PRN
  Administered 2023-09-12: 1 via ORAL
  Filled 2023-09-12: qty 1

## 2023-09-12 NOTE — ED Triage Notes (Signed)
Pt presents to ER with c/o left side flank pain that started 3 days ago, and has been getting worse.  Pt reports associated increased urinary frequency.  Denies n/v/d.  Pt states he was seen here recently for same symptoms.  Pt endorses fever and chills at home.  Pt is otherwise A&O x4 and in NAD.

## 2023-09-13 ENCOUNTER — Emergency Department
Admission: EM | Admit: 2023-09-13 | Discharge: 2023-09-13 | Disposition: A | Discharge status: Home / Self Care | Payer: Medicare HMO | Attending: Emergency Medicine | Admitting: Emergency Medicine

## 2023-09-13 DIAGNOSIS — N390 Urinary tract infection, site not specified: Secondary | ICD-10-CM

## 2023-09-13 LAB — URINALYSIS, ROUTINE W REFLEX MICROSCOPIC
Bilirubin Urine: NEGATIVE
Glucose, UA: NEGATIVE mg/dL
Ketones, ur: NEGATIVE mg/dL
Nitrite: POSITIVE — AB
Protein, ur: NEGATIVE mg/dL
RBC / HPF: >50 RBC/hpf (ref 0–5)
Specific Gravity, Urine: 1.019 (ref 1.005–1.030)
WBC, UA: >50 WBC/hpf (ref 0–5)
pH: 5 (ref 5.0–8.0)

## 2023-09-13 MED ORDER — KETOROLAC TROMETHAMINE 30 MG/ML IJ SOLN
30.0000 mg | Freq: Once | INTRAMUSCULAR | Status: AC
  Administered 2023-09-13: 30 mg via INTRAVENOUS
  Filled 2023-09-13: qty 1

## 2023-09-13 MED ORDER — SODIUM CHLORIDE 0.9 % IV SOLN
2.0000 g | Freq: Once | INTRAVENOUS | Status: AC
  Administered 2023-09-13: 2 g via INTRAVENOUS
  Filled 2023-09-13: qty 20

## 2023-09-13 MED ORDER — SODIUM CHLORIDE 0.9 % IV BOLUS
500.0000 mL | Freq: Once | INTRAVENOUS | Status: AC
  Administered 2023-09-13: 500 mL via INTRAVENOUS

## 2023-09-13 MED ORDER — CEPHALEXIN 500 MG PO CAPS: 500.0000 mg | ORAL_CAPSULE | Freq: Three times a day (TID) | ORAL | 0 refills | Status: AC

## 2023-09-13 MED ORDER — NAPROXEN 500 MG PO TABS: 500.0000 mg | ORAL_TABLET | Freq: Two times a day (BID) | ORAL | 2 refills | Status: AC

## 2023-09-13 NOTE — ED Notes (Signed)
Patient given discharge instructions including prescriptions x2 and importance of follow up appt as needed with stated understanding. INT removed, cannula intact, pressure dressing applied. Patient stable and ambulatory with steady even gait on dispo.

## 2023-09-13 NOTE — ED Provider Notes (Signed)
[facility] Provider Note    Event Date/Time   First MD Initiated Contact with Patient 09/13/23 [phone]     (approximate)   History   Flank Pain (/)   HPI  Martin Zavala [place] is a 67 y.o. male who presents with complaints of urinary frequency, discomfort and signs bilaterally.  Possible fever.  He reports symptoms similar to when he was here in September and was diagnosed with a urinary tract infection     Physical Exam   Triage Vital Signs: ED Triage Vitals [09/12/23 2122]  Encounter Vitals Group     BP (!) 154/87     Systolic BP Percentile      Diastolic BP Percentile      Pulse Rate 93     Resp 20     Temp 100.3 F (37.9 C)     Temp Source Oral     SpO2 92 %     Weight 111.6 kg (246 lb)     Height 1.88 m (6\' 2" )     Head Circumference      Peak Flow      Pain Score 8     Pain Loc      Pain Education      Exclude from Growth Chart     Most recent vital signs: Vitals:   09/13/23 0120 09/13/23 0233  BP: (!) 164/100 127/68  Pulse: 73 70  Resp: 18 18  Temp:  98.9 F (37.2 C)  SpO2: 92% 95%     General: Awake, no distress.  CV:  Good peripheral perfusion.  Resp:  Normal effort.  Abd:  No distention.  No CVA tenderness Other:     ED Results / Procedures / Treatments   Labs (all labs ordered are listed, but only abnormal results are displayed) Labs Reviewed  COMPREHENSIVE METABOLIC PANEL - Abnormal; Notable for the following components:      Result Value   Glucose, Bld 116 (*)    Calcium 10.8 (*)    All other components within normal limits  URINALYSIS, ROUTINE W REFLEX MICROSCOPIC - Abnormal; Notable for the following components:   Color, Urine YELLOW (*)    APPearance HAZY (*)    Hgb urine dipstick LARGE (*)    Nitrite POSITIVE (*)    Leukocytes,Ua LARGE (*)    Bacteria, UA RARE (*)    All other components within normal limits  CBC     EKG     RADIOLOGY CT renal stone study without  ureterolithiasis    PROCEDURES:  Critical Care performed:   Procedures   MEDICATIONS ORDERED IN ED: Medications  oxyCODONE-acetaminophen (PERCOCET/ROXICET) 5-325 MG per tablet 1 tablet (1 tablet Oral Given 09/12/23 2128)  acetaminophen (TYLENOL) tablet 650 mg (650 mg Oral Given 09/12/23 2128)  cefTRIAXone (ROCEPHIN) 2 g in sodium chloride 0.9 % 100 mL IVPB (0 g Intravenous Stopped 09/13/23 0209)  ketorolac (TORADOL) 30 MG/ML injection 30 mg (30 mg Intravenous Given 09/13/23 0137)  sodium chloride 0.9 % bolus 500 mL (0 mLs Intravenous Stopped 09/13/23 0234)     IMPRESSION / MDM / ASSESSMENT AND PLAN / ED COURSE  I reviewed the triage vital signs and the nursing notes. Patient's presentation is most consistent with acute presentation with potential threat to life or bodily function.  Patient presents with urinary frequency, no dysuria, pain in his sides bilaterally, some chills reported.  Lab work demonstrates normal white blood cell count, urinalysis pending  CT scan without evidence  of ureterolithiasis.  Martin Zavala is consistent with urinary tract infection, will treat with IV Rocephin, IV Toradol, IV fluids and reevaluate  Patient feeling much better after treatment, no indication for admission at this time, appropriate for discharge with strict return precautions, patient agrees to this plan.        FINAL CLINICAL IMPRESSION(S) / ED DIAGNOSES   Final diagnoses:  Lower urinary tract infectious disease     Rx / DC Orders   ED Discharge Orders          Ordered    cephALEXin (KEFLEX) 500 MG capsule  3 times daily        09/13/23 0222    naproxen (NAPROSYN) 500 MG tablet  2 times daily with meals        09/13/23 0222             Note:  This document was prepared using Dragon voice recognition software and may include unintentional dictation errors.   Jene Every, MD 09/13/23 [phone]

## 2023-12-15 ENCOUNTER — Other Ambulatory Visit: Payer: Self-pay | Admitting: *Deleted

## 2023-12-15 DIAGNOSIS — N200 Calculus of kidney: Secondary | ICD-10-CM

## 2023-12-16 ENCOUNTER — Ambulatory Visit
Admission: RE | Admit: 2023-12-16 | Discharge: 2023-12-16 | Disposition: A | Discharge status: Home / Self Care | Payer: HMO | Attending: Urology | Admitting: Urology

## 2023-12-16 ENCOUNTER — Encounter: Payer: Self-pay | Admitting: Urology

## 2023-12-16 ENCOUNTER — Ambulatory Visit
Admission: RE | Admit: 2023-12-16 | Discharge: 2023-12-16 | Disposition: A | Discharge status: Home / Self Care | Payer: HMO | Source: Ambulatory Visit | Attending: Urology | Admitting: Urology

## 2023-12-16 ENCOUNTER — Ambulatory Visit (INDEPENDENT_AMBULATORY_CARE_PROVIDER_SITE_OTHER): Payer: HMO | Admitting: Urology

## 2023-12-16 VITALS — BP 122/82 | HR 72 | Ht 74.0 in | Wt 249.0 lb

## 2023-12-16 DIAGNOSIS — N200 Calculus of kidney: Secondary | ICD-10-CM

## 2023-12-16 NOTE — Progress Notes (Signed)
I, Maysun Anabel Bene, acting as a scribe for Riki Altes, MD., have documented all relevant documentation on the behalf of Riki Altes, MD, as directed by Riki Altes, MD while in the presence of Riki Altes, MD.  12/16/2023 10:47 AM   Ala Dach [place] 12-16-1955 [id number]  Referring provider: Armando Gang, FNP [address] [place],  [place] [address]  Chief Complaint  Patient presents with   Nephrolithiasis   Urologic history: 1.  Hematuria CTU 10/2021 bilateral, nonobstructing renal calculi Cystoscopy 12/2021 moderate lateral lobe enlargement with hypervascularity; small intravesical median lobe   2.  Nephrolithiasis 24-hour urine study remarkable for low urine volume at 1820 mL; hypercalciuria at 320 mg; low urine pH 5.4; elevated uric acid supersaturation 1.95; elevated sodium 306 mmol  Migration of a 13 mm calculus to the right renal pelvis October 2023 Right ureteroscopy with laser lithotripsy of 9 and 13 mm calculi 09/14/2022  HPI: Martin Wayne [place] Sr[place] is a 68 y.o. male presents for follow-up visit.  ED visit 08/10/23 for lower urinary tract symptoms and left back pain. CT showed no ureteral calculi or obstruction and urine culture was positive for E. coli. He had a second visit October 2024 with complaints of left flank pain and CT again showed bilateral renal calculi L>R but no ureteral calculi or obstruction. He was treated for UTI.  Presently, he has no flank pain or voiding complaints.  Denies dysuria or gross hematuria.    PMH: Past Medical History:  Diagnosis Date   History of kidney stones    Hypertension    Kidney stones     Surgical History: Past Surgical History:  Procedure Laterality Date   COLONOSCOPY N/A 05/04/2021   Procedure: COLONOSCOPY;  Surgeon: Toney Reil, MD;  Location: [facility] ENDOSCOPY;  Service: Gastroenterology;  Laterality: N/A;   CYSTOSCOPY W/ RETROGRADES  09/14/2022   Procedure: CYSTOSCOPY WITH  RETROGRADE PYELOGRAM;  Surgeon: Riki Altes, MD;  Location: [facility] ORS;  Service: Urology;;   CYSTOSCOPY/URETEROSCOPY/HOLMIUM LASER/STENT PLACEMENT Right 09/14/2022   Procedure: CYSTOSCOPY/URETEROSCOPY/HOLMIUM LASER/STENT PLACEMENT/URETHERAL DILATION;  Surgeon: Riki Altes, MD;  Location: [facility] ORS;  Service: Urology;  Laterality: Right;   CYSTOSCOPY/URETEROSCOPY/HOLMIUM LASER/STENT PLACEMENT - Right     LITHOTRIPSY      Home Medications:  Allergies as of 12/16/2023   No Known Allergies      Medication List        Accurate as of December 16, 2023 10:47 AM. If you have any questions, ask your nurse or doctor.          enalapril 10 MG tablet Commonly known as: VASOTEC Take 10 mg by mouth daily.   EQ Aspirin Adult Low Dose 81 MG tablet Generic drug: aspirin EC Take 81 mg by mouth daily.   ezetimibe 10 MG tablet Commonly known as: ZETIA Take 10 mg by mouth daily.   hydrochlorothiazide 25 MG tablet Commonly known as: HYDRODIURIL Take 25 mg by mouth daily.   naproxen 500 MG tablet Commonly known as: Naprosyn Take 1 tablet (500 mg total) by mouth 2 (two) times daily with a meal.   Potassium Citrate 15 MEQ (1620 MG) Tbcr Take 1 tablet by mouth 2 (two) times daily.   sildenafil 100 MG tablet Commonly known as: VIAGRA Take 100 mg by mouth as needed.   simvastatin 20 MG tablet Commonly known as: ZOCOR Take 20 mg by mouth daily.   VITAMIN D PO Take 1 tablet by mouth daily.  Allergies: No Known Allergies  Social History:  reports that he has never smoked. He has never used smokeless tobacco. He reports that he does not drink alcohol and does not use drugs.   Physical Exam: BP 122/82   Pulse 72   Ht 6\' 2"  (1.88 m)   Wt 249 lb (112.9 kg)   BMI 31.97 kg/m   Constitutional:  Alert and oriented, No acute distress. HEENT: Ages AT Respiratory: Normal respiratory effort, no increased work of breathing Psychiatric: Normal mood and affect.   Pertinent  Imaging: KUB performed today shows left lower pole calculus measuring approximately 16 mm.  CT October 2024 was reviewed and the calculus is within a lower calyx/infundibulum, similar to the previous stone that was treated in October 2023. He may have not cleared all of the lower pole fragments and has had recurrent stone formation.   CT Renal Stone Study  Narrative CLINICAL DATA:  Left-sided flank pain  EXAM: CT ABDOMEN AND PELVIS WITHOUT CONTRAST  TECHNIQUE: Multidetector CT imaging of the abdomen and pelvis was performed following the standard protocol without IV contrast.  RADIATION DOSE REDUCTION: This exam was performed according to the departmental dose-optimization program which includes automated exposure control, adjustment of the mA and/or kV according to patient size and/or use of iterative reconstruction technique.  COMPARISON:  None Available.  FINDINGS: Lower chest: No acute abnormality.  Hepatobiliary: No focal liver abnormality is seen. No gallstones, gallbladder wall thickening, or biliary dilatation.  Pancreas: Unremarkable. No pancreatic ductal dilatation or surrounding inflammatory changes.  Spleen: Normal in size without focal abnormality.  Adrenals/Urinary Tract: Adrenal glands are within normal limits. Right kidney demonstrates a punctate nonobstructing stone in the lower pole. 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 Additional punctate stone is noted on the left. No ureteral stones are seen. No obstructive changes are noted. The bladder is decompressed.  Stomach/Bowel: No obstructive or inflammatory changes of the colon are seen. The appendix is within normal limits. Small bowel and stomach are unremarkable.  Vascular/Lymphatic: Aortic atherosclerosis. No enlarged abdominal or pelvic lymph nodes.  Reproductive: Prostate is unremarkable.  Other: No abdominal wall hernia or abnormality. No  abdominopelvic ascites.  Musculoskeletal: No acute or significant osseous findings.  IMPRESSION: Bilateral nonobstructing calculi worse on the left than the right. No other focal abnormality is noted.   Electronically Signed By: Alcide Clever M.D. On: 09/12/2023 23:42   Assessment & Plan:    1. Left nephrolithiasis 16 mm left lower pole calculus, which is non-obstructing. Discussed ureteroscopy, however he does not desire surgery unless he is having problems.  Follow-up 6 months with KUB.  Instructed to call for any increasing flank pain or bothersome low urinary tract symptoms.   I have reviewed the above documentation for accuracy and completeness, and I agree with the above.   Riki Altes, MD  [facility] [address], [address] [place], [place] [address] [phone]

## 2024-01-06 ENCOUNTER — Encounter: Payer: Self-pay | Admitting: Cardiology

## 2024-01-06 ENCOUNTER — Ambulatory Visit: Payer: HMO | Attending: Cardiology | Admitting: Cardiology

## 2024-01-06 VITALS — BP 110/70 | HR 72 | Ht 74.0 in | Wt 254.0 lb

## 2024-01-06 DIAGNOSIS — R9431 Abnormal electrocardiogram [ECG] [EKG]: Secondary | ICD-10-CM

## 2024-01-06 DIAGNOSIS — I10 Essential (primary) hypertension: Secondary | ICD-10-CM | POA: Diagnosis not present

## 2024-01-06 NOTE — Patient Instructions (Signed)
Medication Instructions:  Your Physician recommend you continue on your current medication as directed.    *If you need a refill on your cardiac medications before your next appointment, please call your pharmacy*   Lab Work: None ordered at this time  If you have labs (blood work) drawn today and your tests are completely normal, you will receive your results only by: MyChart Message (if you have MyChart) OR A paper copy in the mail If you have any lab test that is abnormal or we need to change your treatment, we will call you to review the results.   Follow-Up: At [facility], you and your health needs are our priority.  As part of our continuing mission to provide you with exceptional heart care, we have created designated Provider Care Teams.  These Care Teams include your primary Cardiologist (physician) and Advanced Practice Providers (APPs -  Physician Assistants and Nurse Practitioners) who all work together to provide you with the care you need, when you need it.  We recommend signing up for the patient portal called "MyChart".  Sign up information is provided on this After Visit Summary.  MyChart is used to connect with patients for Virtual Visits (Telemedicine).  Patients are able to view lab/test results, encounter notes, upcoming appointments, etc.  Non-urgent messages can be sent to your provider as well.   To learn more about what you can do with MyChart, go to [email or web address].    Your next appointment:   As needed - call [phone]  Provider:   You may see Dr Azucena Cecil or one of the following Advanced Practice Providers on your designated Care Team:   Nicolasa Ducking, NP Eula Listen, PA-C Cadence Fransico Michael, PA-C Charlsie Quest, NP Carlos Levering, NP

## 2024-01-06 NOTE — Progress Notes (Signed)
 Cardiology Office Note:    Date:  01/06/2024   ID:  Martin Wayne [place] DOB 02-04-1956, MRN [id number]  PCP:  Armando Gang, FNP   [facility] Providers Cardiologist:  None     Referring MD: Armando Gang, FNP   Chief Complaint  Patient presents with   New Patient (Initial Visit)    Referred for cardiac evaluation of 1st degree AV block.  Previous cardiac work up 2 years ago with [facility].     Martin Zavala [place] is a 68 y.o. male who is being seen today for the evaluation of first-degree AV block at the request of Armando Gang, FNP.   History of Present Illness:    Martin Wayne [place] is a 68 y.o. male with a hx of hypertension, hyperlipidemia who presents due to first-degree AV block.  Patient saw her primary care physician about 2 to 3 months ago, noted to have a first-degree AV block on EKG.  He denies chest pain or shortness of breath.  Denies any history of heart disease.  Denies dizziness, presyncope or syncope.  Feels well, has no concerns at this time.  Compliant with blood pressure and cholesterol medicines as prescribed, BP adequately controlled.  Past Medical History:  Diagnosis Date   History of kidney stones    Hypertension    Kidney stones     Past Surgical History:  Procedure Laterality Date   COLONOSCOPY N/A 05/04/2021   Procedure: COLONOSCOPY;  Surgeon: Toney Reil, MD;  Location: [facility] ENDOSCOPY;  Service: Gastroenterology;  Laterality: N/A;   CYSTOSCOPY W/ RETROGRADES  09/14/2022   Procedure: CYSTOSCOPY WITH RETROGRADE PYELOGRAM;  Surgeon: Riki Altes, MD;  Location: [facility] ORS;  Service: Urology;;   CYSTOSCOPY/URETEROSCOPY/HOLMIUM LASER/STENT PLACEMENT Right 09/14/2022   Procedure: CYSTOSCOPY/URETEROSCOPY/HOLMIUM LASER/STENT PLACEMENT/URETHERAL DILATION;  Surgeon: Riki Altes, MD;  Location: [facility] ORS;  Service: Urology;  Laterality: Right;   CYSTOSCOPY/URETEROSCOPY/HOLMIUM LASER/STENT PLACEMENT  - Right     LITHOTRIPSY      Current Medications: Current Meds  Medication Sig   enalapril (VASOTEC) 10 MG tablet Take 10 mg by mouth daily.   EQ ASPIRIN ADULT LOW DOSE 81 MG EC tablet Take 81 mg by mouth daily.   ezetimibe (ZETIA) 10 MG tablet Take 10 mg by mouth daily.   hydrochlorothiazide (HYDRODIURIL) 25 MG tablet Take 25 mg by mouth daily.   naproxen (NAPROSYN) 500 MG tablet Take 1 tablet (500 mg total) by mouth 2 (two) times daily with a meal.   Potassium Citrate 15 MEQ (1620 MG) TBCR Take 1 tablet by mouth 2 (two) times daily.   sildenafil (VIAGRA) 100 MG tablet Take 100 mg by mouth as needed.   simvastatin (ZOCOR) 20 MG tablet Take 20 mg by mouth daily.   VITAMIN D PO Take 1 tablet by mouth daily.     Allergies:   Patient has no known allergies.   Social History   Socioeconomic History   Marital status: Widowed    Spouse name: Not on file   Number of children: Not on file   Years of education: Not on file   Highest education level: Not on file  Occupational History   Not on file  Tobacco Use   Smoking status: Never   Smokeless tobacco: Never  Vaping Use   Vaping status: Never Used  Substance and Sexual Activity   Alcohol use: No   Drug use: Never   Sexual activity: Yes  Other Topics Concern  Not on file  Social History Narrative   Not on file   Social Drivers of Health   Financial Resource Strain: Not on file  Food Insecurity: Not on file  Transportation Needs: Not on file  Physical Activity: Not on file  Stress: Not on file  Social Connections: Not on file     Family History: The patient's family history is negative for Heart disease.  ROS:   Please see the history of present illness.     All other systems reviewed and are negative.  EKGs/Labs/Other Studies Reviewed:    The following studies were reviewed today:  EKG Interpretation Date/Time:  Friday January 06 2024 09:25:40 EST Ventricular Rate:  72 PR Interval:  186 QRS  Duration:  84 QT Interval:  364 QTC Calculation: 398 R Axis:   46  Text Interpretation: Normal sinus rhythm Nonspecific ST and T wave abnormality Confirmed by Debbe Odea ([id number]) on 01/06/2024 9:41:18 AM    Recent Labs: 09/12/2023: ALT 14; BUN 22; Creatinine, Ser 1.23; Hemoglobin 13.6; Platelets 159; Potassium 4.0; Sodium 136  Recent Lipid Panel No results found for: "CHOL", "TRIG", "HDL", "CHOLHDL", "VLDL", "LDLCALC", "LDLDIRECT"   Risk Assessment/Calculations:             Physical Exam:    VS:  BP 110/70 (BP Location: Left Arm, Patient Position: Sitting, Cuff Size: Large)   Pulse 72   Ht 6\' 2"  (1.88 m)   Wt 254 lb (115.2 kg)   SpO2 93%   BMI 32.61 kg/m     Wt Readings from Last 3 Encounters:  01/06/24 254 lb (115.2 kg)  12/16/23 249 lb (112.9 kg)  09/12/23 246 lb (111.6 kg)     GEN:  Well nourished, well developed in no acute distress HEENT: Normal NECK: No JVD; No carotid bruits CARDIAC: RRR, no murmurs, rubs, gallops RESPIRATORY:  Clear to auscultation without rales, wheezing or rhonchi  ABDOMEN: Soft, non-tender, non-distended MUSCULOSKELETAL:  No edema; No deformity  SKIN: Warm and dry NEUROLOGIC:  Alert and oriented x 3 PSYCHIATRIC:  Normal affect   ASSESSMENT:    1. Nonspecific abnormal electrocardiogram (ECG) (EKG)   2. Primary hypertension    PLAN:    In order of problems listed above:  EKG today shows nonspecific ST changes.  PR interval was normal with no evidence for heart block.  Patient educated on etiology for heart block, benign nature of a first-degree AV block even if present.  No negation for additional testing at this time. Hypertension, BP controlled.  Continue HCTZ, Vasotec as prescribed.  Follow-up as needed.      Medication Adjustments/Labs and Tests Ordered: Current medicines are reviewed at length with the patient today.  Concerns regarding medicines are outlined above.  Orders Placed This Encounter  Procedures   EKG  12-Lead   No orders of the defined types were placed in this encounter.   Patient Instructions  Medication Instructions:  Your Physician recommend you continue on your current medication as directed.    *If you need a refill on your cardiac medications before your next appointment, please call your pharmacy*   Lab Work: None ordered at this time  If you have labs (blood work) drawn today and your tests are completely normal, you will receive your results only by: MyChart Message (if you have MyChart) OR A paper copy in the mail If you have any lab test that is abnormal or we need to change your treatment, we will call you to review the results.  Follow-Up: At [facility], you and your health needs are our priority.  As part of our continuing mission to provide you with exceptional heart care, we have created designated Provider Care Teams.  These Care Teams include your primary Cardiologist (physician) and Advanced Practice Providers (APPs -  Physician Assistants and Nurse Practitioners) who all work together to provide you with the care you need, when you need it.  We recommend signing up for the patient portal called "MyChart".  Sign up information is provided on this After Visit Summary.  MyChart is used to connect with patients for Virtual Visits (Telemedicine).  Patients are able to view lab/test results, encounter notes, upcoming appointments, etc.  Non-urgent messages can be sent to your provider as well.   To learn more about what you can do with MyChart, go to [email or web address].    Your next appointment:   As needed - call [phone]  Provider:   You may see Dr Azucena Cecil or one of the following Advanced Practice Providers on your designated Care Team:   Nicolasa Ducking, NP Eula Listen, PA-C Cadence Fransico Michael, PA-C Charlsie Quest, NP Carlos Levering, NP   Signed, Debbe Odea, MD  01/06/2024 10:09 AM    [facility]

## 2024-03-07 ENCOUNTER — Ambulatory Visit: Payer: HMO | Admitting: Cardiology

## 2024-06-14 ENCOUNTER — Ambulatory Visit
Admission: RE | Admit: 2024-06-14 | Discharge: 2024-06-14 | Disposition: A | Discharge status: Home / Self Care | Source: Ambulatory Visit | Attending: Urology | Admitting: Urology

## 2024-06-14 ENCOUNTER — Encounter: Payer: Self-pay | Admitting: Urology

## 2024-06-14 ENCOUNTER — Ambulatory Visit
Admission: RE | Admit: 2024-06-14 | Discharge: 2024-06-14 | Disposition: A | Discharge status: Home / Self Care | Attending: Urology | Admitting: Urology

## 2024-06-14 ENCOUNTER — Ambulatory Visit: Payer: Self-pay | Admitting: Urology

## 2024-06-14 VITALS — BP 127/82 | HR 76 | Ht 75.0 in | Wt 250.0 lb

## 2024-06-14 DIAGNOSIS — N200 Calculus of kidney: Secondary | ICD-10-CM | POA: Diagnosis not present

## 2024-06-14 NOTE — Progress Notes (Signed)
 06/14/2024 9:47 AM   Martin Lin [place] August 17, 1956 [id number]  Referring provider: Donal Channing SQUIBB, FNP [address] [place],  [place] [address]  Chief Complaint  Patient presents with   Nephrolithiasis   Urologic history: 1.  Hematuria CTU 10/2021 bilateral, nonobstructing renal calculi Cystoscopy 12/2021 moderate lateral lobe enlargement with hypervascularity; small intravesical median lobe   2.  Nephrolithiasis 24-hour urine study remarkable for low urine volume at 1820 mL; hypercalciuria at 320 mg; low urine pH 5.4; elevated uric acid supersaturation 1.95; elevated sodium 306 mmol  Migration of a 13 mm calculus to the right renal pelvis October 2023 Right ureteroscopy with laser lithotripsy of 9 and 13 mm calculi 09/14/2022 CT 08/2023 bilateral nonobstructing renal calculi with left lower pole calculi measuring up to 16 mm  HPI: Martin Wayne [place] is a 68 y.o. male presents for follow-up visit.  No complaints since last visit.   Denies recurrent UTI Denies dysuria or gross hematuria.  No flank, abdominal or pelvic pain   PMH: Past Medical History:  Diagnosis Date   History of kidney stones    Hypertension    Kidney stones     Surgical History: Past Surgical History:  Procedure Laterality Date   COLONOSCOPY N/A 05/04/2021   Procedure: COLONOSCOPY;  Surgeon: Unk Corinn Skiff, MD;  Location: [facility] ENDOSCOPY;  Service: Gastroenterology;  Laterality: N/A;   CYSTOSCOPY W/ RETROGRADES  09/14/2022   Procedure: CYSTOSCOPY WITH RETROGRADE PYELOGRAM;  Surgeon: Twylla Glendia BROCKS, MD;  Location: [facility] ORS;  Service: Urology;;   CYSTOSCOPY/URETEROSCOPY/HOLMIUM LASER/STENT PLACEMENT Right 09/14/2022   Procedure: CYSTOSCOPY/URETEROSCOPY/HOLMIUM LASER/STENT PLACEMENT/URETHERAL DILATION;  Surgeon: Twylla Glendia BROCKS, MD;  Location: [facility] ORS;  Service: Urology;  Laterality: Right;   CYSTOSCOPY/URETEROSCOPY/HOLMIUM LASER/STENT PLACEMENT - Right     LITHOTRIPSY       Home Medications:  Allergies as of 06/14/2024   No Known Allergies      Medication List        Accurate as of June 14, 2024  9:47 AM. If you have any questions, ask your nurse or doctor.          enalapril 10 MG tablet Commonly known as: VASOTEC Take 10 mg by mouth daily.   EQ Aspirin Adult Low Dose 81 MG tablet Generic drug: aspirin EC Take 81 mg by mouth daily.   ezetimibe 10 MG tablet Commonly known as: ZETIA Take 10 mg by mouth daily.   hydrochlorothiazide 25 MG tablet Commonly known as: HYDRODIURIL Take 25 mg by mouth daily.   naproxen  500 MG tablet Commonly known as: Naprosyn  Take 1 tablet (500 mg total) by mouth 2 (two) times daily with a meal.   Potassium Citrate  15 MEQ (1620 MG) Tbcr Take 1 tablet by mouth 2 (two) times daily.   sildenafil 100 MG tablet Commonly known as: VIAGRA Take 100 mg by mouth as needed.   simvastatin 20 MG tablet Commonly known as: ZOCOR Take 20 mg by mouth daily.   VITAMIN D PO Take 1 tablet by mouth daily.        Allergies: No Known Allergies  Social History:  reports that he has never smoked. He has never used smokeless tobacco. He reports that he does not drink alcohol and does not use drugs.   Physical Exam: BP 127/82   Pulse 76   Ht 6' 3 (1.905 m)   Wt 250 lb (113.4 kg)   BMI 31.25 kg/m   Constitutional:  Alert and oriented, No acute distress. HEENT:   AT Respiratory: Normal respiratory effort, no increased work of breathing Psychiatric: Normal mood and affect.   Pertinent Imaging: KUB performed prior to today's appointment was personally reviewed and interpreted.  There are 2 left lower pole calculi which appear stable compared with prior KUB.  No definite right-sided calculi are identified    Assessment & Plan:    1.  Bilateral nephrolithiasis Stable left renal calculi on today's KUB Asymptomatic Instructed to call for development of fever, flank pain, renal colic 1 year follow-up with  KUB   Glendia JAYSON Barba, MD  [facility] [address], [address] [place], [place] [address] [phone]

## 2024-09-03 ENCOUNTER — Other Ambulatory Visit: Payer: Self-pay | Admitting: Urology

## 2024-11-05 ENCOUNTER — Other Ambulatory Visit: Payer: Self-pay | Admitting: Physician Assistant

## 2025-06-14 ENCOUNTER — Ambulatory Visit: Payer: Self-pay | Admitting: Urology
